# Patient Record
Sex: Female | Born: 1950 | Race: White | Hispanic: No | Marital: Married | Smoking: Never smoker
Health system: Southern US, Community
[De-identification: ages and names within clinical notes are randomized; demographics above are authoritative.]

## PROBLEM LIST (undated history)

## (undated) DIAGNOSIS — I10 Essential (primary) hypertension: Secondary | ICD-10-CM

## (undated) DIAGNOSIS — E785 Hyperlipidemia, unspecified: Secondary | ICD-10-CM

## (undated) HISTORY — PX: ABDOMINAL HYSTERECTOMY: SHX81

## (undated) HISTORY — DX: Essential (primary) hypertension: I10

## (undated) HISTORY — DX: Hyperlipidemia, unspecified: E78.5

---

## 1998-10-19 ENCOUNTER — Other Ambulatory Visit: Admission: RE | Admit: 1998-10-19 | Discharge: 1998-10-19 | Payer: Self-pay | Admitting: Obstetrics and Gynecology

## 1998-10-28 ENCOUNTER — Encounter: Payer: Self-pay | Admitting: Obstetrics and Gynecology

## 1998-10-28 ENCOUNTER — Ambulatory Visit (HOSPITAL_COMMUNITY): Admission: RE | Admit: 1998-10-28 | Discharge: 1998-10-28 | Payer: Self-pay | Admitting: Obstetrics and Gynecology

## 1999-11-01 ENCOUNTER — Ambulatory Visit (HOSPITAL_COMMUNITY): Admission: RE | Admit: 1999-11-01 | Discharge: 1999-11-01 | Payer: Self-pay | Admitting: Obstetrics and Gynecology

## 1999-11-01 ENCOUNTER — Encounter: Payer: Self-pay | Admitting: Obstetrics and Gynecology

## 2000-10-01 ENCOUNTER — Other Ambulatory Visit: Admission: RE | Admit: 2000-10-01 | Discharge: 2000-10-01 | Payer: Self-pay | Admitting: Obstetrics and Gynecology

## 2000-11-04 ENCOUNTER — Encounter: Payer: Self-pay | Admitting: Obstetrics and Gynecology

## 2000-11-04 ENCOUNTER — Ambulatory Visit (HOSPITAL_COMMUNITY): Admission: RE | Admit: 2000-11-04 | Discharge: 2000-11-04 | Payer: Self-pay | Admitting: Obstetrics and Gynecology

## 2001-10-01 ENCOUNTER — Other Ambulatory Visit: Admission: RE | Admit: 2001-10-01 | Discharge: 2001-10-01 | Payer: Self-pay | Admitting: Obstetrics and Gynecology

## 2001-11-19 ENCOUNTER — Encounter: Payer: Self-pay | Admitting: Obstetrics and Gynecology

## 2001-11-19 ENCOUNTER — Ambulatory Visit (HOSPITAL_COMMUNITY): Admission: RE | Admit: 2001-11-19 | Discharge: 2001-11-19 | Payer: Self-pay | Admitting: Obstetrics and Gynecology

## 2003-10-19 ENCOUNTER — Ambulatory Visit (HOSPITAL_COMMUNITY): Admission: RE | Admit: 2003-10-19 | Discharge: 2003-10-19 | Payer: Self-pay | Admitting: Obstetrics and Gynecology

## 2004-10-30 ENCOUNTER — Ambulatory Visit (HOSPITAL_COMMUNITY): Admission: RE | Admit: 2004-10-30 | Discharge: 2004-10-30 | Payer: Self-pay | Admitting: Obstetrics and Gynecology

## 2005-02-09 ENCOUNTER — Other Ambulatory Visit: Admission: RE | Admit: 2005-02-09 | Discharge: 2005-02-09 | Payer: Self-pay | Admitting: Obstetrics and Gynecology

## 2005-12-26 ENCOUNTER — Ambulatory Visit (HOSPITAL_COMMUNITY): Admission: RE | Admit: 2005-12-26 | Discharge: 2005-12-26 | Payer: Self-pay | Admitting: Obstetrics and Gynecology

## 2006-02-12 ENCOUNTER — Other Ambulatory Visit: Admission: RE | Admit: 2006-02-12 | Discharge: 2006-02-12 | Payer: Self-pay | Admitting: Obstetrics and Gynecology

## 2007-02-14 ENCOUNTER — Other Ambulatory Visit: Admission: RE | Admit: 2007-02-14 | Discharge: 2007-02-14 | Payer: Self-pay | Admitting: Obstetrics and Gynecology

## 2007-06-24 ENCOUNTER — Inpatient Hospital Stay (HOSPITAL_COMMUNITY): Admission: AD | Admit: 2007-06-24 | Discharge: 2007-06-26 | Payer: Self-pay | Admitting: Surgery

## 2008-03-03 ENCOUNTER — Other Ambulatory Visit: Admission: RE | Admit: 2008-03-03 | Discharge: 2008-03-03 | Payer: Self-pay | Admitting: Obstetrics and Gynecology

## 2009-03-29 ENCOUNTER — Other Ambulatory Visit: Admission: RE | Admit: 2009-03-29 | Discharge: 2009-03-29 | Payer: Self-pay | Admitting: Obstetrics and Gynecology

## 2010-03-30 ENCOUNTER — Other Ambulatory Visit: Admission: RE | Admit: 2010-03-30 | Discharge: 2010-03-30 | Payer: Self-pay | Admitting: Obstetrics and Gynecology

## 2011-04-17 NOTE — Discharge Summary (Signed)
[name], Tonya Palmer              ACCOUNT NO.:  [id number]   MEDICAL RECORD NO.:  [id number]          PATIENT TYPE:  INP   LOCATION:  1323                         FACILITY:  [facility]   PHYSICIAN:  Ardeth Sportsman, MD     DATE OF BIRTH:  1950/12/19   DATE OF ADMISSION:  06/24/2007  DATE OF DISCHARGE:  06/26/2007                               DISCHARGE SUMMARY   PRIMARY CARE PHYSICIAN:  Kirby Funk, M.D.   SURGEON:  Karie Soda, M.D.   DIAGNOSES:  1. Cellulitis of right posterior thigh, methicillin-resistant      Staphylococcus aureus.  2. Hypertension.  3. Hypercholesterolemia.  4. Status post hysterectomy.  5. Status post incision and drainage of right gluteal abscess on May 03, 2007, responsive to Keflex.   PROCEDURE PERFORMED:  Incision and drainage of right posterior thigh  abscess on June 19, 2007.   DATE OF ADMISSION:  June 24, 2007.   DATE OF DISCHARGE:  June 26, 2007.   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  [facility] and was started on Keflex.  She  did not improve and saw her primary care physician who switched her over  to doxycycline and sent her over to our office.  We saw her later that  day and Dr. Violeta Gelinas performed incision and drainage on June 19, 2007.  On followup the next day seemed to show improvement.  She went to  work and unfortunately after 3 days of work her pain worsened and she  came in on June 24, 2007.  Her redness had markedly worsened and she was  much more painful.  Based on concerns, I recommended admission and she  agreed.  She was placed on IV vancomycin and after 48 hours of dosing  her redness and erythema markedly improved.  She remained afebrile.  She  did not have any significant leukocytosis.  The induration markedly  softened up.  Wound cleaned up rather well also with a 25 x 35 mm ulcer  that remained rather superficial with no more deep areas.  Based on  these improvements I agree she will be discharged home with the  following instructions:  1. She should take Bactrim DS two p.o. b.i.d. for the next 2 weeks      with 1 refill if needed.  2. She should follow up and see me in about a week for wound check to      make sure this continues to improve.  3. She should not return to work until the June 30, 2007, to avoid re-      exacerbating any further tissues.  4. She should do warm packs and compresses as needed for pain and      ibuprofen 600 mg p.o. q.a.c. and q.h.s. to help decrease pain and      induration.  5. She already had a prescription for some Darvocet q.4-6h. p.r.n.  pain.  I think it is reasonable to continue that.  6. She can continue her home medication which includes      lisinopril/hydrochlorothiazide 10/12.5 p.o. daily,      aspirin 81 mg daily, multivitamin daily, Lipitor 10 mg daily, and      calcium daily.  7. She should call if she has worsening pain, swelling or discomfort      or any other concerns.   Note, I did send a work note for the patient as well.      Ardeth Sportsman, MD  Electronically Signed     SCG/MEDQ  D:  06/26/2007  T:  06/26/2007  Job:  [id number]   cc:   Thora Lance, M.D.  Fax: [phone]

## 2011-04-17 NOTE — H&P (Signed)
Tonya Palmer, Tonya Palmer              ACCOUNT NO.:  [id number]   MEDICAL RECORD NO.:  [id number]          PATIENT TYPE:  INP   LOCATION:  1323                         FACILITY:  [facility]   PHYSICIAN:  Ardeth Sportsman, MD     DATE OF BIRTH:  July 01, 1951   DATE OF ADMISSION:  06/24/2007  DATE OF DISCHARGE:                              HISTORY & PHYSICAL   PRIMARY CARE PHYSICIAN:  Dr. Kirby Funk   REASON FOR ADMISSION:  MRSA cellulitis right posterior thigh, worse  despite outpatient therapy.   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  She had a buttock abscess that  resolved after incision and drainage and Keflex earlier in the year.  However, last week she started noting some pain, swelling and discomfort  of the right posterior thigh.  She went to an urgent care center and was  started on Keflex.  She went to see her primary care physician a few  days later on June 17 and was started on doxycycline.  She was sent to  our clinic and underwent incision and drainage 3 days ago.  She was  improved on a visit the following day.  She follows up today because she  is having worsening pain and discomfort.   She denies any history of MRSA exposure but she does work with a Museum/gallery exhibitions officer.  She denies any fevers, chills, sweats.  She was up and working  all through the weekend, 12 hours at a time.  She denies any new other  sores or lesions.   PAST MEDICAL HISTORY:  1. Hypertension.  2. Hypercholesterolemia.   PAST SURGICAL HISTORY:  1. She had a hysterectomy.  2. Incision and drainage of right gluteal abscess on May 31.  3. Incision and drainage right posterior thigh abscess on May 20, 2007.   MEDICATIONS:  1. Doxycycline 100 mg p.o. b.i.d.  2. Lisinopril daily.  3. Lipitor 10 mg daily.  4. Aspirin 81 mg daily.  5. Calcium daily.  6. Multivitamin daily.   ALLERGIES:  None.   SOCIAL HISTORY:  No tobacco  use.  Drinks one glass of wine.  Works as a  Engineer, civil (consulting) over at [employer].   FAMILY HISTORY:  Her father died of heart disease.  Her mother and  brother are alive and well.  She has two sisters with diabetes and one  with breast cancer.  Note, 15-point comprehensive review of systems is  reviewed with the patient in our chart and is all essentially normal  except for the skin.  Otherwise, constitutional, eyes, ENT,  cardiovascular, pulmonary, GI, GU, breast, skin, musculoskeletal,  neurological, psychiatric, endocrine, heme, lymph and immunologic are  otherwise negative except for her skin as noted per HPI.   VITAL SIGNS:  Temperature 98.3, pulse is 70, respirations 18, blood  pressure 130/80.  Height 5 feet 6 inches, weight 194.  GENERAL:  She is a well-developed, well-nourished female in no acute  distress, slightly overweight.  PSYCHIATRIC:  She is pleasant, interactive, with at least average  intelligence.  No evidence of dementia, delirium, psychosis, paranoia.  EYES:  Pupils equal, round, and reactive to light.  Extraocular  movements are intact.  Sclerae are not icteric or injected.  ENT:  Normocephalic, no facial asymmetry.  Mucous membranes moist.  Nasopharynx and oropharynx clear.  NECK:  Supple without any masses.  Trachea is midline.  HEART:  Regular rate and rhythm.  No murmurs, gallops or rubs.  CHEST:  Clear to auscultation bilaterally.  No wheezes, rales, or  rhonchi.  ABDOMEN:  Soft, nontender, nondistended.  SKIN:  On her right posterior thigh she has a 3 x 4-cm ulcer with some  granulation base.  I went ahead and debrided that and encountered some  pretty good granulation tissue that is exquisitely tender.  She does  have some small pits but I cannot express any more purulence.  She has  about a 15 x 25-cm patch of erythema with warmth and some tenderness.  No evidence of any other dermatologic abnormalities on her skin.  LYMPH:  No head, neck,  axillary or groin lymphadenopathy except for some  shotty lymphadenopathy in the right groin but subtle.  BREAST:  Deferred.  RECTAL:  Deferred.  EXTREMITIES:  Otherwise, no clubbing or cyanosis.  MUSCULOSKELETAL:  Full range of motion of the shoulders, elbows, wrists,  hips, knees, ankles.   STUDIES:  She has a culture that is consistent with methicillin-  resistant Staphylococcus aureus.  It is sensitive to doxycycline.   ASSESSMENT AND PLAN:  Right posterior thigh abscess that has failed  outpatient management with appropriate oral antibiotic, incision and  drainage, with worsening erythema.   I had a long conversation with the patient and also had one with my  senior partners, Dr. Derrell Lolling, discuss with the patient as well.  Because  she has failed oral antibiotic therapy, incision and drainage, with  worsening erythema I strongly recommend she have admission and placed on  IV antibiotics.  If she is not improved she may require incision and  debridement to further evaluate this and that would require surgery as  it is exquisitely tender and she cannot tolerate any further dissection.  She was initially hesitant on this but understands that if we are not  more aggressive it will worsen, and certainly she has tried all  outpatient options.  If she improves, maybe we can switch her over to  oral antibiotics in a couple of days if the erythema goes down versus  sending her on outpatient vancomycin therapy.  She agrees to proceed.      Ardeth Sportsman, MD  Electronically Signed    SCG/MEDQ  D:  06/24/2007  T:  06/24/2007  Job:  [id number]

## 2011-09-17 LAB — CBC
HCT: 40.6
MCV: 88.1
Platelets: 279
Platelets: 309
RBC: 4.39
RDW: 13.5
WBC: 8.1
WBC: 8.4

## 2011-09-17 LAB — BASIC METABOLIC PANEL
BUN: 17
Chloride: 105
Creatinine, Ser: 0.84
GFR calc Af Amer: >60
GFR calc non Af Amer: >60
Potassium: 3.9

## 2014-12-06 ENCOUNTER — Ambulatory Visit
Admission: RE | Admit: 2014-12-06 | Discharge: 2014-12-06 | Disposition: A | Discharge status: Home / Self Care | Payer: Commercial Managed Care - HMO | Source: Ambulatory Visit | Attending: Internal Medicine | Admitting: Internal Medicine

## 2014-12-06 ENCOUNTER — Other Ambulatory Visit: Payer: Self-pay | Admitting: Internal Medicine

## 2014-12-06 DIAGNOSIS — M25562 Pain in left knee: Secondary | ICD-10-CM

## 2014-12-06 DIAGNOSIS — M25462 Effusion, left knee: Secondary | ICD-10-CM | POA: Diagnosis not present

## 2014-12-06 DIAGNOSIS — M1712 Unilateral primary osteoarthritis, left knee: Secondary | ICD-10-CM | POA: Diagnosis not present

## 2014-12-22 DIAGNOSIS — M25562 Pain in left knee: Secondary | ICD-10-CM | POA: Diagnosis not present

## 2014-12-22 DIAGNOSIS — M1712 Unilateral primary osteoarthritis, left knee: Secondary | ICD-10-CM | POA: Diagnosis not present

## 2014-12-22 DIAGNOSIS — M2242 Chondromalacia patellae, left knee: Secondary | ICD-10-CM | POA: Diagnosis not present

## 2014-12-22 DIAGNOSIS — M25462 Effusion, left knee: Secondary | ICD-10-CM | POA: Diagnosis not present

## 2015-03-01 DIAGNOSIS — I10 Essential (primary) hypertension: Secondary | ICD-10-CM | POA: Diagnosis not present

## 2015-03-08 DIAGNOSIS — M1712 Unilateral primary osteoarthritis, left knee: Secondary | ICD-10-CM | POA: Diagnosis not present

## 2015-03-08 DIAGNOSIS — M25562 Pain in left knee: Secondary | ICD-10-CM | POA: Diagnosis not present

## 2015-03-08 DIAGNOSIS — M2242 Chondromalacia patellae, left knee: Secondary | ICD-10-CM | POA: Diagnosis not present

## 2015-04-26 DIAGNOSIS — M1712 Unilateral primary osteoarthritis, left knee: Secondary | ICD-10-CM | POA: Diagnosis not present

## 2015-05-04 DIAGNOSIS — M1712 Unilateral primary osteoarthritis, left knee: Secondary | ICD-10-CM | POA: Diagnosis not present

## 2015-05-11 DIAGNOSIS — M1712 Unilateral primary osteoarthritis, left knee: Secondary | ICD-10-CM | POA: Diagnosis not present

## 2015-09-02 DIAGNOSIS — I10 Essential (primary) hypertension: Secondary | ICD-10-CM | POA: Diagnosis not present

## 2015-09-02 DIAGNOSIS — Z23 Encounter for immunization: Secondary | ICD-10-CM | POA: Diagnosis not present

## 2015-09-02 DIAGNOSIS — Z1389 Encounter for screening for other disorder: Secondary | ICD-10-CM | POA: Diagnosis not present

## 2015-09-02 DIAGNOSIS — Z6832 Body mass index (BMI) 32.0-32.9, adult: Secondary | ICD-10-CM | POA: Diagnosis not present

## 2015-09-02 DIAGNOSIS — E669 Obesity, unspecified: Secondary | ICD-10-CM | POA: Diagnosis not present

## 2015-09-02 DIAGNOSIS — K635 Polyp of colon: Secondary | ICD-10-CM | POA: Diagnosis not present

## 2015-09-02 DIAGNOSIS — E780 Pure hypercholesterolemia: Secondary | ICD-10-CM | POA: Diagnosis not present

## 2015-11-04 DIAGNOSIS — Z800 Family history of malignant neoplasm of digestive organs: Secondary | ICD-10-CM | POA: Diagnosis not present

## 2015-11-16 ENCOUNTER — Other Ambulatory Visit: Payer: Self-pay

## 2015-11-16 DIAGNOSIS — Z1231 Encounter for screening mammogram for malignant neoplasm of breast: Secondary | ICD-10-CM

## 2015-12-14 DIAGNOSIS — K621 Rectal polyp: Secondary | ICD-10-CM | POA: Diagnosis not present

## 2015-12-14 DIAGNOSIS — Z800 Family history of malignant neoplasm of digestive organs: Secondary | ICD-10-CM | POA: Diagnosis not present

## 2015-12-14 DIAGNOSIS — K5730 Diverticulosis of large intestine without perforation or abscess without bleeding: Secondary | ICD-10-CM | POA: Diagnosis not present

## 2015-12-15 ENCOUNTER — Ambulatory Visit
Admission: RE | Admit: 2015-12-15 | Discharge: 2015-12-15 | Disposition: A | Discharge status: Home / Self Care | Payer: Commercial Managed Care - HMO | Source: Ambulatory Visit

## 2015-12-15 DIAGNOSIS — Z1231 Encounter for screening mammogram for malignant neoplasm of breast: Secondary | ICD-10-CM | POA: Diagnosis not present

## 2015-12-22 ENCOUNTER — Other Ambulatory Visit: Payer: Self-pay | Admitting: Internal Medicine

## 2015-12-22 DIAGNOSIS — R928 Other abnormal and inconclusive findings on diagnostic imaging of breast: Secondary | ICD-10-CM

## 2015-12-23 ENCOUNTER — Ambulatory Visit
Admission: RE | Admit: 2015-12-23 | Discharge: 2015-12-23 | Disposition: A | Discharge status: Home / Self Care | Payer: Commercial Managed Care - HMO | Source: Ambulatory Visit | Attending: Internal Medicine | Admitting: Internal Medicine

## 2015-12-23 DIAGNOSIS — N63 Unspecified lump in breast: Secondary | ICD-10-CM | POA: Diagnosis not present

## 2015-12-23 DIAGNOSIS — N6001 Solitary cyst of right breast: Secondary | ICD-10-CM | POA: Diagnosis not present

## 2015-12-23 DIAGNOSIS — R928 Other abnormal and inconclusive findings on diagnostic imaging of breast: Secondary | ICD-10-CM

## 2016-01-21 IMAGING — CR DG KNEE 1-2V*L*
2 series · 2 of 2 positions shown · non-contrast
Comparison: None.

CLINICAL DATA: 63-year-old female with chronic left knee pain. No
injury. Initial encounter.

EXAM:
LEFT KNEE - 1-2 VIEW

[view not recorded (1 of 2)]
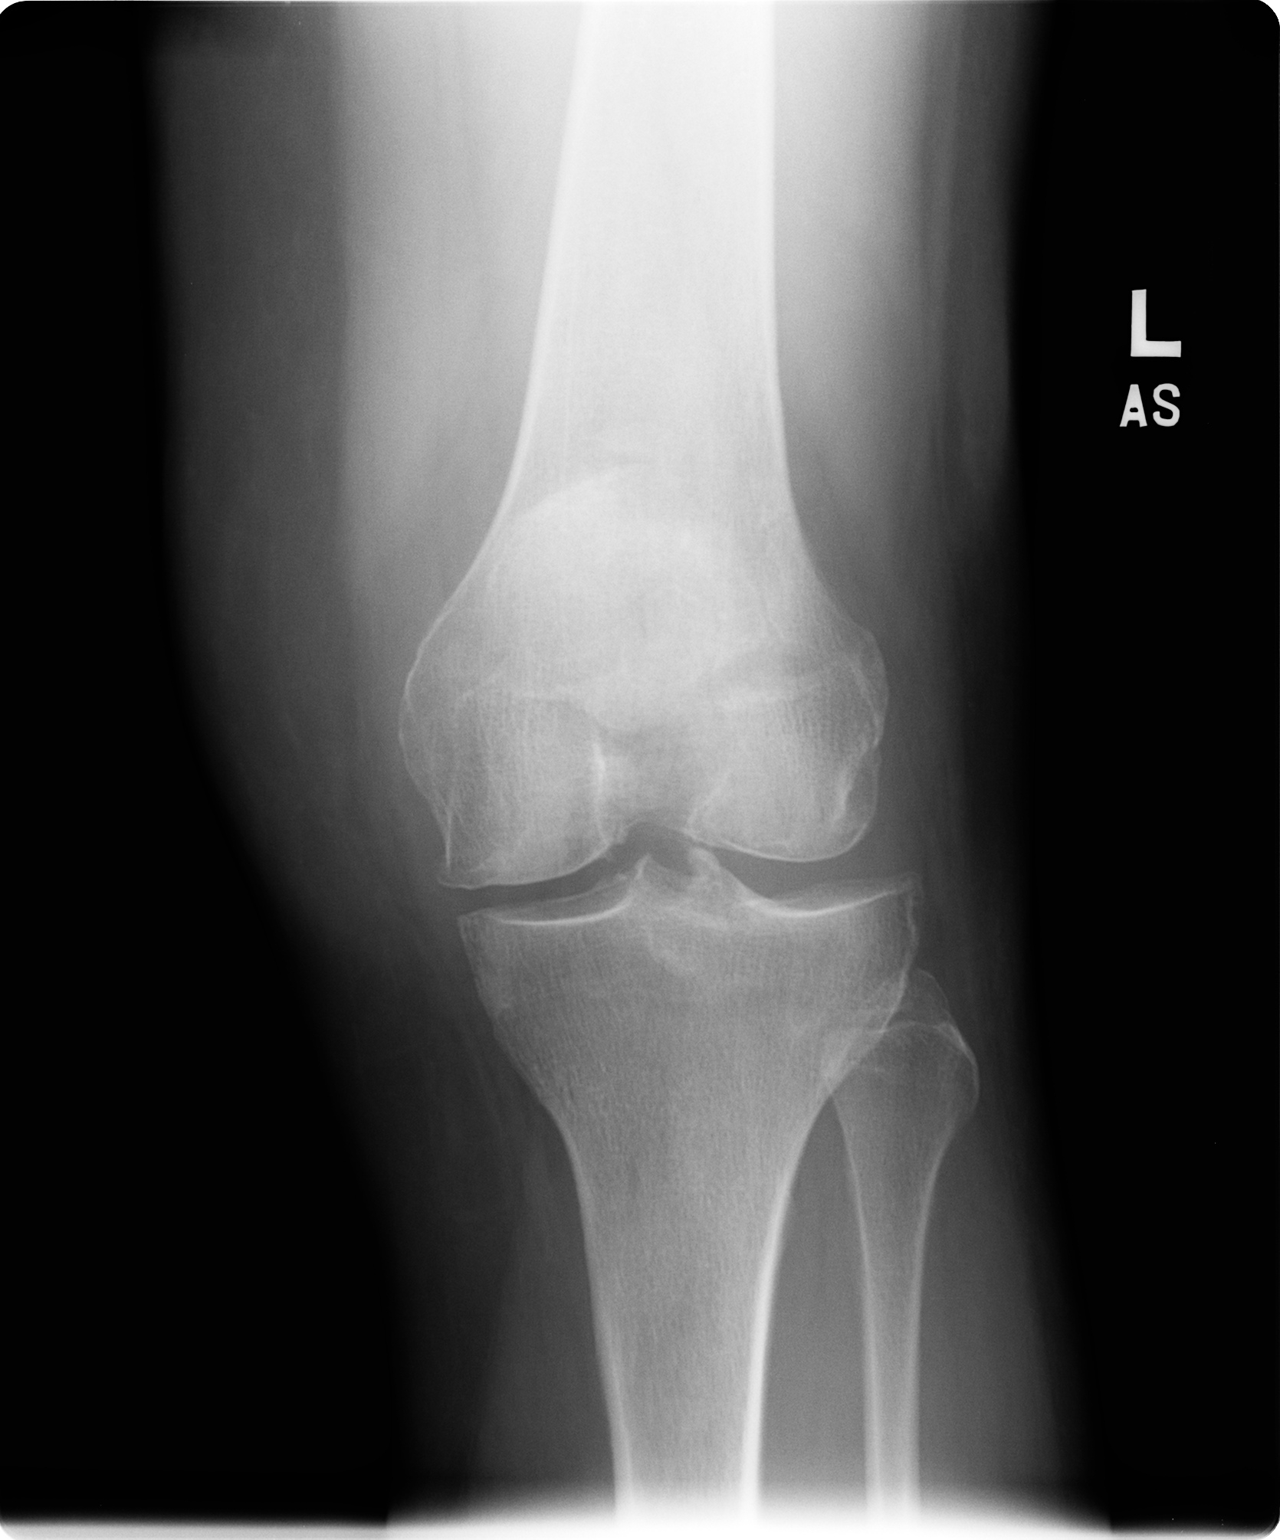

[view not recorded (2 of 2)]
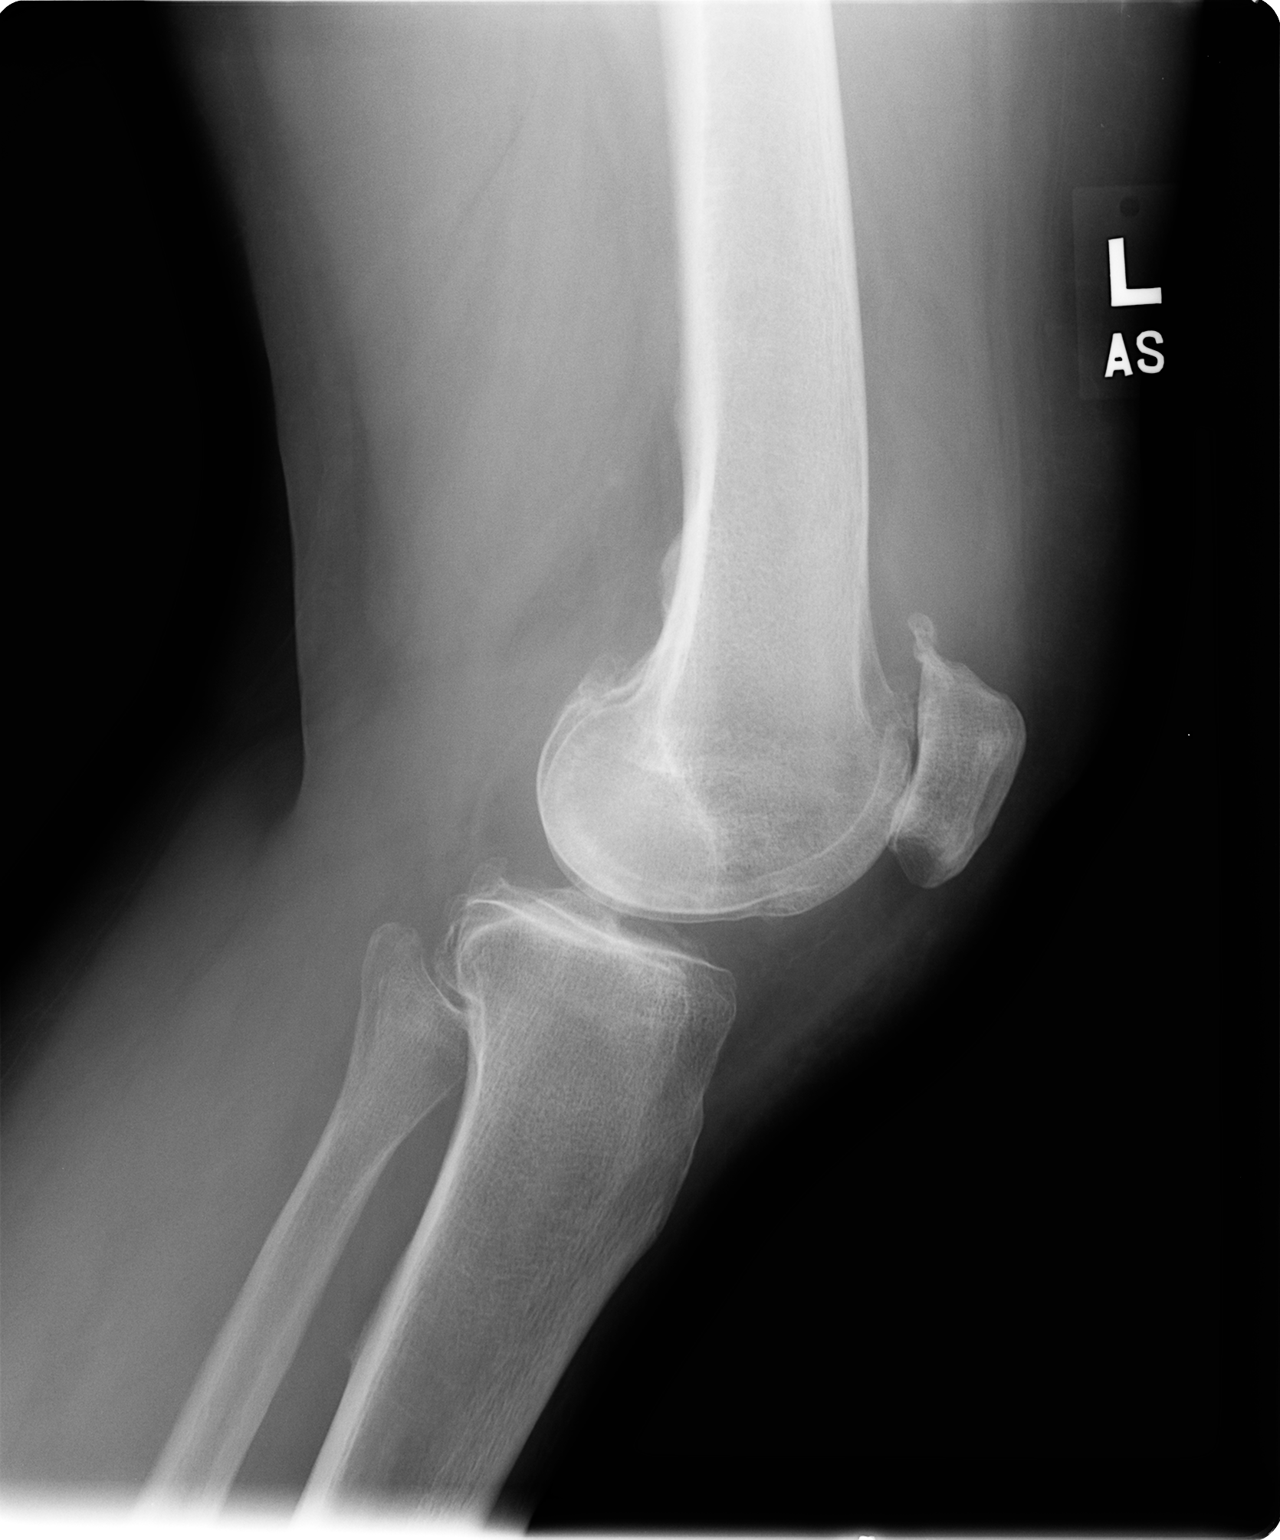

[2 of 2 positions shown; findings below may reference images not displayed]

FINDINGS: Moderate to marked patellofemoral joint degenerative changes with
slightly high riding patella.

Mild to slightly moderate medial tibiofemoral joint space narrowing
with minimal shift of the femur medially compared to the distal
tibia.

Joint effusion.

No fracture or dislocation is noted on this two view exam.
IMPRESSION: Degenerative changes most prominent involving the patellofemoral
joint space. Less notable degenerative changes medial tibiofemoral
joint space.

Joint effusion.

## 2016-05-03 DIAGNOSIS — Z01419 Encounter for gynecological examination (general) (routine) without abnormal findings: Secondary | ICD-10-CM | POA: Diagnosis not present

## 2016-05-03 DIAGNOSIS — Z7189 Other specified counseling: Secondary | ICD-10-CM | POA: Diagnosis not present

## 2016-07-03 DIAGNOSIS — L237 Allergic contact dermatitis due to plants, except food: Secondary | ICD-10-CM | POA: Diagnosis not present

## 2016-07-03 DIAGNOSIS — R600 Localized edema: Secondary | ICD-10-CM | POA: Diagnosis not present

## 2016-07-05 DIAGNOSIS — R600 Localized edema: Secondary | ICD-10-CM | POA: Diagnosis not present

## 2016-07-05 DIAGNOSIS — L237 Allergic contact dermatitis due to plants, except food: Secondary | ICD-10-CM | POA: Diagnosis not present

## 2016-09-04 DIAGNOSIS — Z6830 Body mass index (BMI) 30.0-30.9, adult: Secondary | ICD-10-CM | POA: Diagnosis not present

## 2016-09-04 DIAGNOSIS — Z1389 Encounter for screening for other disorder: Secondary | ICD-10-CM | POA: Diagnosis not present

## 2016-09-04 DIAGNOSIS — E7800 Pure hypercholesterolemia, unspecified: Secondary | ICD-10-CM | POA: Diagnosis not present

## 2016-09-04 DIAGNOSIS — Z23 Encounter for immunization: Secondary | ICD-10-CM | POA: Diagnosis not present

## 2016-09-04 DIAGNOSIS — E669 Obesity, unspecified: Secondary | ICD-10-CM | POA: Diagnosis not present

## 2016-09-04 DIAGNOSIS — Z0000 Encounter for general adult medical examination without abnormal findings: Secondary | ICD-10-CM | POA: Diagnosis not present

## 2016-09-04 DIAGNOSIS — I10 Essential (primary) hypertension: Secondary | ICD-10-CM | POA: Diagnosis not present

## 2016-11-12 ENCOUNTER — Other Ambulatory Visit: Payer: Self-pay | Admitting: Internal Medicine

## 2016-11-12 DIAGNOSIS — Z1231 Encounter for screening mammogram for malignant neoplasm of breast: Secondary | ICD-10-CM

## 2016-12-20 ENCOUNTER — Ambulatory Visit: Payer: Commercial Managed Care - HMO

## 2016-12-21 ENCOUNTER — Ambulatory Visit
Admission: RE | Admit: 2016-12-21 | Discharge: 2016-12-21 | Disposition: A | Discharge status: Home / Self Care | Payer: Medicare HMO | Source: Ambulatory Visit | Attending: Internal Medicine | Admitting: Internal Medicine

## 2016-12-21 ENCOUNTER — Encounter: Payer: Self-pay | Admitting: Radiology

## 2016-12-21 DIAGNOSIS — Z1231 Encounter for screening mammogram for malignant neoplasm of breast: Secondary | ICD-10-CM

## 2017-02-26 DIAGNOSIS — M1711 Unilateral primary osteoarthritis, right knee: Secondary | ICD-10-CM | POA: Diagnosis not present

## 2017-02-26 DIAGNOSIS — G8929 Other chronic pain: Secondary | ICD-10-CM | POA: Diagnosis not present

## 2017-02-26 DIAGNOSIS — M2242 Chondromalacia patellae, left knee: Secondary | ICD-10-CM | POA: Diagnosis not present

## 2017-02-26 DIAGNOSIS — M25561 Pain in right knee: Secondary | ICD-10-CM | POA: Diagnosis not present

## 2017-02-26 DIAGNOSIS — M1712 Unilateral primary osteoarthritis, left knee: Secondary | ICD-10-CM | POA: Diagnosis not present

## 2017-05-07 DIAGNOSIS — Z01419 Encounter for gynecological examination (general) (routine) without abnormal findings: Secondary | ICD-10-CM | POA: Diagnosis not present

## 2017-05-07 DIAGNOSIS — M1712 Unilateral primary osteoarthritis, left knee: Secondary | ICD-10-CM | POA: Diagnosis not present

## 2017-05-14 DIAGNOSIS — M1712 Unilateral primary osteoarthritis, left knee: Secondary | ICD-10-CM | POA: Diagnosis not present

## 2017-05-21 DIAGNOSIS — M1712 Unilateral primary osteoarthritis, left knee: Secondary | ICD-10-CM | POA: Diagnosis not present

## 2017-09-06 DIAGNOSIS — I10 Essential (primary) hypertension: Secondary | ICD-10-CM | POA: Diagnosis not present

## 2017-09-06 DIAGNOSIS — Z1159 Encounter for screening for other viral diseases: Secondary | ICD-10-CM | POA: Diagnosis not present

## 2017-09-06 DIAGNOSIS — E7800 Pure hypercholesterolemia, unspecified: Secondary | ICD-10-CM | POA: Diagnosis not present

## 2017-09-06 DIAGNOSIS — Z23 Encounter for immunization: Secondary | ICD-10-CM | POA: Diagnosis not present

## 2017-09-06 DIAGNOSIS — Z1389 Encounter for screening for other disorder: Secondary | ICD-10-CM | POA: Diagnosis not present

## 2017-09-06 DIAGNOSIS — E669 Obesity, unspecified: Secondary | ICD-10-CM | POA: Diagnosis not present

## 2017-09-06 DIAGNOSIS — Z6831 Body mass index (BMI) 31.0-31.9, adult: Secondary | ICD-10-CM | POA: Diagnosis not present

## 2017-09-06 DIAGNOSIS — Z0000 Encounter for general adult medical examination without abnormal findings: Secondary | ICD-10-CM | POA: Diagnosis not present

## 2017-10-29 DIAGNOSIS — R6884 Jaw pain: Secondary | ICD-10-CM | POA: Diagnosis not present

## 2017-11-13 ENCOUNTER — Other Ambulatory Visit: Payer: Self-pay | Admitting: Internal Medicine

## 2017-11-13 DIAGNOSIS — Z1231 Encounter for screening mammogram for malignant neoplasm of breast: Secondary | ICD-10-CM

## 2017-12-23 ENCOUNTER — Ambulatory Visit
Admission: RE | Admit: 2017-12-23 | Discharge: 2017-12-23 | Disposition: A | Discharge status: Home / Self Care | Payer: Medicare HMO | Source: Ambulatory Visit | Attending: Internal Medicine | Admitting: Internal Medicine

## 2017-12-23 DIAGNOSIS — Z1231 Encounter for screening mammogram for malignant neoplasm of breast: Secondary | ICD-10-CM | POA: Diagnosis not present

## 2018-09-19 DIAGNOSIS — I10 Essential (primary) hypertension: Secondary | ICD-10-CM | POA: Diagnosis not present

## 2018-09-19 DIAGNOSIS — Z1389 Encounter for screening for other disorder: Secondary | ICD-10-CM | POA: Diagnosis not present

## 2018-09-19 DIAGNOSIS — Z0000 Encounter for general adult medical examination without abnormal findings: Secondary | ICD-10-CM | POA: Diagnosis not present

## 2018-09-19 DIAGNOSIS — Z23 Encounter for immunization: Secondary | ICD-10-CM | POA: Diagnosis not present

## 2018-09-19 DIAGNOSIS — E669 Obesity, unspecified: Secondary | ICD-10-CM | POA: Diagnosis not present

## 2018-09-19 DIAGNOSIS — E7800 Pure hypercholesterolemia, unspecified: Secondary | ICD-10-CM | POA: Diagnosis not present

## 2018-11-20 ENCOUNTER — Other Ambulatory Visit: Payer: Self-pay | Admitting: Internal Medicine

## 2018-11-20 DIAGNOSIS — Z1231 Encounter for screening mammogram for malignant neoplasm of breast: Secondary | ICD-10-CM

## 2018-12-25 ENCOUNTER — Ambulatory Visit
Admission: RE | Admit: 2018-12-25 | Discharge: 2018-12-25 | Disposition: A | Discharge status: Home / Self Care | Payer: Medicare HMO | Source: Ambulatory Visit | Attending: Internal Medicine | Admitting: Internal Medicine

## 2018-12-25 DIAGNOSIS — Z1231 Encounter for screening mammogram for malignant neoplasm of breast: Secondary | ICD-10-CM

## 2019-03-20 DIAGNOSIS — S90561A Insect bite (nonvenomous), right ankle, initial encounter: Secondary | ICD-10-CM | POA: Diagnosis not present

## 2019-03-20 DIAGNOSIS — W57XXXA Bitten or stung by nonvenomous insect and other nonvenomous arthropods, initial encounter: Secondary | ICD-10-CM | POA: Diagnosis not present

## 2019-06-18 DIAGNOSIS — Z01419 Encounter for gynecological examination (general) (routine) without abnormal findings: Secondary | ICD-10-CM | POA: Diagnosis not present

## 2019-06-18 DIAGNOSIS — N898 Other specified noninflammatory disorders of vagina: Secondary | ICD-10-CM | POA: Diagnosis not present

## 2019-10-02 DIAGNOSIS — I10 Essential (primary) hypertension: Secondary | ICD-10-CM | POA: Diagnosis not present

## 2019-10-02 DIAGNOSIS — E7800 Pure hypercholesterolemia, unspecified: Secondary | ICD-10-CM | POA: Diagnosis not present

## 2019-10-02 DIAGNOSIS — Z1389 Encounter for screening for other disorder: Secondary | ICD-10-CM | POA: Diagnosis not present

## 2019-10-02 DIAGNOSIS — Z0000 Encounter for general adult medical examination without abnormal findings: Secondary | ICD-10-CM | POA: Diagnosis not present

## 2019-10-02 DIAGNOSIS — Z23 Encounter for immunization: Secondary | ICD-10-CM | POA: Diagnosis not present

## 2019-10-31 DIAGNOSIS — Z20828 Contact with and (suspected) exposure to other viral communicable diseases: Secondary | ICD-10-CM | POA: Diagnosis not present

## 2019-11-16 ENCOUNTER — Other Ambulatory Visit: Payer: Self-pay | Admitting: Internal Medicine

## 2019-11-16 DIAGNOSIS — Z1231 Encounter for screening mammogram for malignant neoplasm of breast: Secondary | ICD-10-CM

## 2020-01-01 ENCOUNTER — Other Ambulatory Visit: Payer: Self-pay

## 2020-01-01 ENCOUNTER — Ambulatory Visit
Admission: RE | Admit: 2020-01-01 | Discharge: 2020-01-01 | Disposition: A | Discharge status: Home / Self Care | Payer: Medicare HMO | Source: Ambulatory Visit | Attending: Internal Medicine | Admitting: Internal Medicine

## 2020-01-01 DIAGNOSIS — Z1231 Encounter for screening mammogram for malignant neoplasm of breast: Secondary | ICD-10-CM

## 2020-04-29 DIAGNOSIS — R310 Gross hematuria: Secondary | ICD-10-CM | POA: Diagnosis not present

## 2020-04-29 DIAGNOSIS — N3001 Acute cystitis with hematuria: Secondary | ICD-10-CM | POA: Diagnosis not present

## 2020-04-29 DIAGNOSIS — R3915 Urgency of urination: Secondary | ICD-10-CM | POA: Diagnosis not present

## 2020-04-29 DIAGNOSIS — R300 Dysuria: Secondary | ICD-10-CM | POA: Diagnosis not present

## 2020-10-04 DIAGNOSIS — Z7184 Encounter for health counseling related to travel: Secondary | ICD-10-CM | POA: Diagnosis not present

## 2020-10-04 DIAGNOSIS — I10 Essential (primary) hypertension: Secondary | ICD-10-CM | POA: Diagnosis not present

## 2020-10-04 DIAGNOSIS — R059 Cough, unspecified: Secondary | ICD-10-CM | POA: Diagnosis not present

## 2020-10-04 DIAGNOSIS — Z23 Encounter for immunization: Secondary | ICD-10-CM | POA: Diagnosis not present

## 2020-10-04 DIAGNOSIS — Z0000 Encounter for general adult medical examination without abnormal findings: Secondary | ICD-10-CM | POA: Diagnosis not present

## 2020-10-04 DIAGNOSIS — E7800 Pure hypercholesterolemia, unspecified: Secondary | ICD-10-CM | POA: Diagnosis not present

## 2020-12-22 ENCOUNTER — Other Ambulatory Visit: Payer: Self-pay | Admitting: Internal Medicine

## 2020-12-22 DIAGNOSIS — Z1231 Encounter for screening mammogram for malignant neoplasm of breast: Secondary | ICD-10-CM

## 2020-12-22 DIAGNOSIS — R350 Frequency of micturition: Secondary | ICD-10-CM | POA: Diagnosis not present

## 2021-02-03 ENCOUNTER — Ambulatory Visit
Admission: RE | Admit: 2021-02-03 | Discharge: 2021-02-03 | Disposition: A | Discharge status: Home / Self Care | Payer: Medicare HMO | Source: Ambulatory Visit | Attending: Internal Medicine | Admitting: Internal Medicine

## 2021-02-03 ENCOUNTER — Other Ambulatory Visit: Payer: Self-pay

## 2021-02-03 DIAGNOSIS — Z1231 Encounter for screening mammogram for malignant neoplasm of breast: Secondary | ICD-10-CM

## 2021-06-19 DIAGNOSIS — Z01419 Encounter for gynecological examination (general) (routine) without abnormal findings: Secondary | ICD-10-CM | POA: Diagnosis not present

## 2021-10-06 DIAGNOSIS — I10 Essential (primary) hypertension: Secondary | ICD-10-CM | POA: Diagnosis not present

## 2021-10-06 DIAGNOSIS — Z5181 Encounter for therapeutic drug level monitoring: Secondary | ICD-10-CM | POA: Diagnosis not present

## 2021-10-06 DIAGNOSIS — Z1389 Encounter for screening for other disorder: Secondary | ICD-10-CM | POA: Diagnosis not present

## 2021-10-06 DIAGNOSIS — Z0000 Encounter for general adult medical examination without abnormal findings: Secondary | ICD-10-CM | POA: Diagnosis not present

## 2021-10-06 DIAGNOSIS — E7800 Pure hypercholesterolemia, unspecified: Secondary | ICD-10-CM | POA: Diagnosis not present

## 2021-10-06 DIAGNOSIS — Z23 Encounter for immunization: Secondary | ICD-10-CM | POA: Diagnosis not present

## 2021-12-26 ENCOUNTER — Other Ambulatory Visit: Payer: Self-pay | Admitting: Internal Medicine

## 2021-12-26 DIAGNOSIS — Z1231 Encounter for screening mammogram for malignant neoplasm of breast: Secondary | ICD-10-CM

## 2022-02-15 ENCOUNTER — Ambulatory Visit
Admission: RE | Admit: 2022-02-15 | Discharge: 2022-02-15 | Disposition: A | Discharge status: Home / Self Care | Payer: Medicare HMO | Source: Ambulatory Visit | Attending: Internal Medicine | Admitting: Internal Medicine

## 2022-02-15 DIAGNOSIS — Z1231 Encounter for screening mammogram for malignant neoplasm of breast: Secondary | ICD-10-CM | POA: Diagnosis not present

## 2022-03-21 DIAGNOSIS — Z800 Family history of malignant neoplasm of digestive organs: Secondary | ICD-10-CM | POA: Diagnosis not present

## 2022-03-21 DIAGNOSIS — K649 Unspecified hemorrhoids: Secondary | ICD-10-CM | POA: Diagnosis not present

## 2022-03-21 DIAGNOSIS — D122 Benign neoplasm of ascending colon: Secondary | ICD-10-CM | POA: Diagnosis not present

## 2022-03-21 DIAGNOSIS — Z1211 Encounter for screening for malignant neoplasm of colon: Secondary | ICD-10-CM | POA: Diagnosis not present

## 2022-03-21 DIAGNOSIS — K5730 Diverticulosis of large intestine without perforation or abscess without bleeding: Secondary | ICD-10-CM | POA: Diagnosis not present

## 2022-03-23 DIAGNOSIS — D122 Benign neoplasm of ascending colon: Secondary | ICD-10-CM | POA: Diagnosis not present

## 2022-04-18 DIAGNOSIS — H52223 Regular astigmatism, bilateral: Secondary | ICD-10-CM | POA: Diagnosis not present

## 2022-04-18 DIAGNOSIS — H524 Presbyopia: Secondary | ICD-10-CM | POA: Diagnosis not present

## 2022-10-10 DIAGNOSIS — Z780 Asymptomatic menopausal state: Secondary | ICD-10-CM | POA: Diagnosis not present

## 2022-10-10 DIAGNOSIS — Z23 Encounter for immunization: Secondary | ICD-10-CM | POA: Diagnosis not present

## 2022-10-10 DIAGNOSIS — R1901 Right upper quadrant abdominal swelling, mass and lump: Secondary | ICD-10-CM | POA: Diagnosis not present

## 2022-10-10 DIAGNOSIS — I10 Essential (primary) hypertension: Secondary | ICD-10-CM | POA: Diagnosis not present

## 2022-10-10 DIAGNOSIS — M1710 Unilateral primary osteoarthritis, unspecified knee: Secondary | ICD-10-CM | POA: Diagnosis not present

## 2022-10-10 DIAGNOSIS — Z1331 Encounter for screening for depression: Secondary | ICD-10-CM | POA: Diagnosis not present

## 2022-10-10 DIAGNOSIS — E7800 Pure hypercholesterolemia, unspecified: Secondary | ICD-10-CM | POA: Diagnosis not present

## 2022-10-10 DIAGNOSIS — Z0000 Encounter for general adult medical examination without abnormal findings: Secondary | ICD-10-CM | POA: Diagnosis not present

## 2022-10-10 DIAGNOSIS — R10811 Right upper quadrant abdominal tenderness: Secondary | ICD-10-CM | POA: Diagnosis not present

## 2022-10-10 DIAGNOSIS — K5730 Diverticulosis of large intestine without perforation or abscess without bleeding: Secondary | ICD-10-CM | POA: Diagnosis not present

## 2022-10-17 DIAGNOSIS — R10811 Right upper quadrant abdominal tenderness: Secondary | ICD-10-CM | POA: Diagnosis not present

## 2022-10-17 DIAGNOSIS — N2889 Other specified disorders of kidney and ureter: Secondary | ICD-10-CM | POA: Diagnosis not present

## 2022-10-18 ENCOUNTER — Other Ambulatory Visit: Payer: Self-pay | Admitting: Internal Medicine

## 2022-10-18 ENCOUNTER — Ambulatory Visit
Admission: RE | Admit: 2022-10-18 | Discharge: 2022-10-18 | Disposition: A | Discharge status: Home / Self Care | Payer: Medicare HMO | Source: Ambulatory Visit | Attending: Internal Medicine | Admitting: Internal Medicine

## 2022-10-18 DIAGNOSIS — N2889 Other specified disorders of kidney and ureter: Secondary | ICD-10-CM | POA: Diagnosis not present

## 2022-10-18 DIAGNOSIS — K5730 Diverticulosis of large intestine without perforation or abscess without bleeding: Secondary | ICD-10-CM | POA: Diagnosis not present

## 2022-10-18 DIAGNOSIS — R1900 Intra-abdominal and pelvic swelling, mass and lump, unspecified site: Secondary | ICD-10-CM

## 2022-10-18 DIAGNOSIS — N839 Noninflammatory disorder of ovary, fallopian tube and broad ligament, unspecified: Secondary | ICD-10-CM | POA: Diagnosis not present

## 2022-10-18 DIAGNOSIS — K769 Liver disease, unspecified: Secondary | ICD-10-CM | POA: Diagnosis not present

## 2022-10-18 MED ORDER — IOPAMIDOL (ISOVUE-300) INJECTION 61%
100.0000 mL | Freq: Once | INTRAVENOUS | Status: AC | PRN
  Administered 2022-10-18: 100 mL via INTRAVENOUS

## 2022-10-22 ENCOUNTER — Telehealth: Payer: Self-pay | Admitting: Physician Assistant

## 2022-10-22 NOTE — Telephone Encounter (Signed)
Scheduled appointment per 11/20. Patient is aware of appointment date and time. Patient is aware to arrive 15 mins prior to appointment time and to bring updated insurance cards. Patient is aware of location.

## 2022-10-22 NOTE — Progress Notes (Signed)
[facility] [phone]   [phone]  INITIAL CONSULTATION:  Palmer Care Team: Lavone Orn, MD as PCP - General (Internal Medicine)  CHIEF COMPLAINTS/PURPOSE OF CONSULTATION:  Abnormal CT concerning for malignancy  HISTORY OF PRESENTING ILLNESS: Tonya Palmer 71 y.o. female with medical history significant for hyperlipidemia and hypertension presents to Tonya diagnostic clinic for recent CT imaging concerning for malignancy.  On review of Tonya previous records, Ms. Yohe presented to PCP for annual physical on 10/10/2022. Palmer was reporting RLQ abdominal pain and physical exam findings included purulent area noted on Tonya right quadrant and down to Tonya right mid abdomen tender to palpation.  Palmer underwent abdominal ultrasound, 10/18/2019.  It revealed large solid heterogeneous left renal mass concerning for malignancy with heterogeneous solid vascular mass in Tonya right lower quadrant measuring up to 11.2 cm. CT imaging of Tonya abdomen pelvis on 10/18/2022.  Findings again showed 8.3 cm solid exophytic mass of Tonya left kidney upper pole favoring renal cell carcinoma versus metastatic lesion.  Additionally there was evidence of a 12.4 cm multi lobar heterogeneously enhancing right lower quadrant mesenteric mass, numerous scattered pulmonary nodules and a nonspecific 1.2 cm hypodense lesion inferiorly to Tonya right hepatic lobe.  On exam today, Tonya Palmer is anxious after hearing Tonya news. She doesn't notice any fatigue but has some decreased appetite. She reports an approximately 5 lb weight loss over Tonya last couple of weeks. She denies nausea or vomiting. She reports mild RLQ abdominal discomfort with palpation. She does not require any pain medication at this time. Her bowel habits are unchanged without any recurrent episodes of diarrhea or constipation. She denies easy bruising or signs of active bleeding. She denies fevers,  chills, night sweats, shortness of breath, chest pain, cough, peripheral edema, neuropathy or urinary symptoms. She has no other complaints. Rest of Tonya 10 point ROS is below.   MEDICAL HISTORY:  Past Medical History:  Diagnosis Date   Hyperlipidemia    Hypertension     SURGICAL HISTORY: Past Surgical History:  Procedure Laterality Date   ABDOMINAL HYSTERECTOMY     fibroids    SOCIAL HISTORY: Social History   Socioeconomic History   Marital status: Married    Spouse name: Not on file   Number of children: Not on file   Years of education: Not on file   Highest education level: Not on file  Occupational History   Not on file  Tobacco Use   Smoking status: Never   Smokeless tobacco: Never  Substance and Sexual Activity   Alcohol use: Yes    Comment: once a month   Drug use: Never   Sexual activity: Not on file  Other Topics Concern   Not on file  Social History Narrative   Not on file   Social Determinants of Health   Financial Resource Strain: Not on file  Food Insecurity: Not on file  Transportation Needs: Not on file  Physical Activity: Not on file  Stress: Not on file  Social Connections: Not on file  Intimate Partner Violence: Not on file    FAMILY HISTORY: Family History  Problem Relation Age of Onset   Breast cancer Sister        BRCA positive   Stomach cancer Sister     ALLERGIES:  is allergic to sulfamethoxazole-trimethoprim.  MEDICATIONS:  Current Outpatient Medications  Medication Sig Dispense Refill   atorvastatin (LIPITOR) 10 MG tablet Take 10 mg by mouth daily.  Multiple Vitamin (MULTIVITAMIN) capsule Take 1 capsule by mouth daily. Taking 3 times a week     valsartan-hydrochlorothiazide (DIOVAN-HCT) 160-12.5 MG tablet Take 1 tablet by mouth daily.     No current facility-administered medications for this visit.    REVIEW OF SYSTEMS:   Constitutional: ( - ) fevers, ( - )  chills , ( - ) night sweats Eyes: ( - ) blurriness of  vision, ( - ) double vision, ( - ) watery eyes Ears, nose, mouth, throat, and face: ( - ) mucositis, ( - ) sore throat Respiratory: ( - ) cough, ( - ) dyspnea, ( - ) wheezes Cardiovascular: ( - ) palpitation, ( - ) chest discomfort, ( - ) lower extremity swelling Gastrointestinal:  ( - ) nausea, ( - ) heartburn, ( - ) change in bowel habits Skin: ( - ) abnormal skin rashes Lymphatics: ( - ) new lymphadenopathy, ( - ) easy bruising Neurological: ( - ) numbness, ( - ) tingling, ( - ) new weaknesses Behavioral/Psych: ( - ) mood change, ( - ) new changes  All other systems were reviewed with Tonya Palmer and are negative.  PHYSICAL EXAMINATION: ECOG PERFORMANCE STATUS: 0 - Asymptomatic  Vitals:   10/23/22 1211  BP: (!) 149/81  Pulse: 89  Resp: 17  Temp: 97.7 F (36.5 C)  SpO2: 96%   Filed Weights   10/23/22 1211  Weight: 178 lb 6.4 oz (80.9 kg)    GENERAL: well appearing female in NAD  SKIN: skin color, texture, turgor are normal, no rashes or significant lesions EYES: conjunctiva are pink and non-injected, sclera clear OROPHARYNX: no exudate, no erythema; lips, buccal mucosa, and tongue normal  NECK: supple, non-tender LYMPH:  no palpable lymphadenopathy in Tonya cervical or supraclavicular lymph nodes.  LUNGS: clear to auscultation and percussion with normal breathing effort HEART: regular rate & rhythm and no murmurs and no lower extremity edema ABDOMEN: soft, normal bowel sounds. Palpable nodularity in Tonya RLQ, mild tenderness.  Musculoskeletal: no cyanosis of digits and no clubbing  PSYCH: alert & oriented x 3, fluent speech NEURO: no focal motor/sensory deficits  LABORATORY DATA:  I have reviewed Tonya data as listed    Latest Ref Rng & Units 10/23/2022    2:01 PM 06/25/2007    4:40 AM 06/24/2007    4:12 PM  CBC  WBC 4.0 - 10.5 K/uL 7.2  8.4  8.1   Hemoglobin 12.0 - 15.0 g/dL 13.9  13.4  13.9   Hematocrit 36.0 - 46.0 % 41.2  38.6  40.6   Platelets 150 - 400 K/uL 279   279  309        Latest Ref Rng & Units 10/23/2022    2:01 PM 06/25/2007    4:40 AM  CMP  Glucose 70 - 99 mg/dL 93  94   BUN 8 - 23 mg/dL 17  17   Creatinine 0.44 - 1.00 mg/dL 1.02  0.84   Sodium 135 - 145 mmol/L 139  139   Potassium 3.5 - 5.1 mmol/L 4.5  3.9   Chloride 98 - 111 mmol/L 104  105   CO2 22 - 32 mmol/L 28  26   Calcium 8.9 - 10.3 mg/dL 10.1  9.1   Total Protein 6.5 - 8.1 g/dL 7.4    Total Bilirubin 0.3 - 1.2 mg/dL 0.7    Alkaline Phos 38 - 126 U/L 44    AST 15 - 41 U/L 25    ALT 0 -  44 U/L 17       RADIOGRAPHIC STUDIES: I have personally reviewed Tonya radiological images as listed and agreed with Tonya findings in Tonya report. CT ABDOMEN PELVIS W CONTRAST  Result Date: 10/18/2022 CLINICAL DATA:  Solid left renal mass on ultrasound compatible with malignancy. Solid right lower quadrant mass in Tonya abdomen on ultrasound. Staging workup and further characterization * Tracking Code: BO * EXAM: CT ABDOMEN AND PELVIS WITH CONTRAST TECHNIQUE: Multidetector CT imaging of Tonya abdomen and pelvis was performed using Tonya standard protocol following bolus administration of intravenous contrast. RADIATION DOSE REDUCTION: This exam was performed according to Tonya departmental dose-optimization program which includes automated exposure control, adjustment of the mA and/or kV according to Palmer size and/or use of iterative reconstruction technique. CONTRAST:  173m ISOVUE-300 IOPAMIDOL (ISOVUE-300) INJECTION 61% COMPARISON:  Abdominal ultrasound of 10/17/2022 FINDINGS: Lower chest: Numerous scattered pulmonary nodules in Tonya lung bases concerning for metastatic disease. Index 0.9 by 0.8 cm nodule on image 5 series 5. Hepatobiliary: Nonspecific and indistinct 1.3 cm hypodense lesion inferiorly in Tonya right hepatic lobe on image 30 series 2. Malignancy is not excluded. Gallbladder unremarkable. Pancreas: Unremarkable Spleen: Unremarkable Adrenals/Urinary Tract: Both adrenal glands appear normal.  Solid exophytic 7.8 by 8.3 by 8.2 cm mass of Tonya left kidney upper pole favoring renal cell carcinoma versus less likely metastatic lesion. No definite tumor thrombus in Tonya left renal vein. Right kidney unremarkable. Effaced left kidney upper pole collecting system without hydronephrosis. Stomach/Bowel: Some bowel loops are displaced by Tonya heterogeneously enhancing irregular multilobular 12.4 by 11.7 by 6.1 cm right lower quadrant mesenteric mass. This is not definitively arising from bowel. This lesion abuts Tonya terminal ileum and Tonya appendix is not well seen separate from Tonya mass. Mild sigmoid colon diverticulosis. Vascular/Lymphatic: Atherosclerosis is present, including aortoiliac atherosclerotic disease. No periaortic adenopathy or tumor thrombus in Tonya left renal vein or SVC. Reproductive: Uterus absent. Tonya right lower quadrant mesenteric mass abuts Tonya right ovary. Other: No supplemental non-categorized findings. Musculoskeletal: Sclerosis along Tonya pubic symphysis suggesting mild osteitis pubis. Mild dextroconvex lumbar scoliosis. No definite osseous metastatic lesion identified. Grade 1 degenerative anterolisthesis at L4-5 with lumbar spondylosis and degenerative disc disease resulting in multilevel foraminal impingement. IMPRESSION: 1. 8.3 cm solid exophytic mass of Tonya left kidney upper pole favoring renal cell carcinoma versus less likely metastatic lesion. No tumor thrombus in Tonya left renal vein or SVC. 2. 12.4 cm multilobular heterogeneously enhancing right lower quadrant mesenteric mass. This is not definitively arising from bowel but abuts both bowel and Tonya right ovary. In general I favor this being a metastatic lesion over a primary ovarian or bowel related tumor. 3. Numerous scattered pulmonary nodules in Tonya lung bases suspicious for metastatic disease. CT of Tonya chest is recommended to completely assess Tonya degree of thoracic involvement. Alternatively, PET-CT could be utilized. 4.  Nonspecific 1.3 cm hypodense lesion inferiorly in Tonya right hepatic lobe. Malignancy is not excluded. 5. Mild sigmoid colon diverticulosis. 6. Lumbar spondylosis and degenerative disc disease causing multilevel foraminal impingement. Electronically Signed   By: WVan ClinesM.D.   On: 10/18/2022 12:11    ASSESSMENT & PLAN Tonya PRESASis a 71y.o. female who presents to Tonya diagnostic clinic for recent CT imaging that is concerning for underlying malignancy. I reviewed Tonya CT imaging that shows a 8.3 cm left kidney mass, 12.4 cm RLQ mesenteric mass, 1.3 cm hypodense right liver lesion and numerous scattered pulmonary nodules. Based on CT imaging, we will  request an urgent biopsy of Tonya large RLQ mesenteric mass. Additionally, we will obtain CT chest to complete staging.   #Abnormal CT scan concerning for metastatic disease: --Labs today to check CBC and CMP --STAT biopsy of RLQ mesenteric mass requested --Need CT chest to complete staging --RTC once workup is complete to discuss treatment recommendations.   Orders Placed This Encounter  Procedures   CT Chest W Contrast    Standing Status:   Future    Number of Occurrences:   1    Standing Expiration Date:   10/22/2023    Order Specific Question:   If indicated for Tonya ordered procedure, I authorize Tonya administration of contrast media per Radiology protocol    Answer:   Yes    Order Specific Question:   Does Tonya Palmer have a contrast media/X-ray dye allergy?    Answer:   No    Order Specific Question:   Preferred imaging location?    Answer:   [facility]   CT ABDOMINAL MASS BIOPSY    Standing Status:   Future    Standing Expiration Date:   10/23/2023    Order Specific Question:   Lab orders requested (DO NOT place separate lab orders, these will be automatically ordered during procedure specimen collection):    Answer:   Surgical Pathology    Order Specific Question:   Reason for Exam (SYMPTOM  OR DIAGNOSIS REQUIRED)     Answer:   biopsy large RLQ abdominal mass    Order Specific Question:   Preferred location?    Answer:   [facility]   CBC with Differential ([place] Only)    Standing Status:   Future    Number of Occurrences:   1    Standing Expiration Date:   10/23/2023   CMP ([place] only)    Standing Status:   Future    Number of Occurrences:   1    Standing Expiration Date:   10/23/2023    All questions were answered. Tonya Palmer knows to call Tonya clinic with any problems, questions or concerns.  I have spent a total of 60 minutes minutes of face-to-face and non-face-to-face time, preparing to see Tonya Palmer, obtaining and/or reviewing separately obtained history, performing a medically appropriate examination, counseling and educating Tonya Palmer, ordering tests/procedures, referring and communicating with other health care professionals, documenting clinical information in Tonya electronic health record,  and care coordination.   Dede Query, PA-C Department of Hematology/Oncology [place] at [facility] Phone: [phone]  Palmer was seen with Dr. Lorenso Courier   I have read Tonya above note and personally examined Tonya Palmer. I agree with Tonya assessment and plan as noted above.  Briefly Tonya Palmer is a 71 year old female who presents for evaluation of numerous lesions noted on CT scan concerning for metastatic disease.  CT scan on 10/18/2022 showed an 8.3 cm left kidney mass, 12.4 cm RLQ mesenteric mass, 1.3 cm hypodense right liver lesion and numerous scattered pulmonary nodules.  At this time would recommend a CT scan of Tonya chest in order to complete staging imaging.  Additionally will need an ultrasound-guided biopsy of Tonya right lower quadrant mass.  Once we have obtained complete imaging and a tissue biopsy we will have Tonya Palmer return to clinic to discuss steps moving forward.  Tonya Palmer voiced understanding Tonya plan moving  forward.   Ledell Peoples, MD Department of Hematology/Oncology [facility] at [facility]  Hospital Phone: [phone] Pager: [phone] Email: [email or web address]

## 2022-10-23 ENCOUNTER — Inpatient Hospital Stay: Payer: Medicare HMO | Attending: Physician Assistant | Admitting: Physician Assistant

## 2022-10-23 ENCOUNTER — Inpatient Hospital Stay: Payer: Medicare HMO

## 2022-10-23 ENCOUNTER — Other Ambulatory Visit: Payer: Self-pay

## 2022-10-23 ENCOUNTER — Encounter: Payer: Self-pay | Admitting: Physician Assistant

## 2022-10-23 VITALS — BP 149/81 | HR 89 | Temp 97.7°F | Resp 17 | Ht 65.0 in | Wt 178.4 lb

## 2022-10-23 DIAGNOSIS — N2889 Other specified disorders of kidney and ureter: Secondary | ICD-10-CM | POA: Insufficient documentation

## 2022-10-23 DIAGNOSIS — Z800 Family history of malignant neoplasm of digestive organs: Secondary | ICD-10-CM | POA: Diagnosis not present

## 2022-10-23 DIAGNOSIS — C7989 Secondary malignant neoplasm of other specified sites: Secondary | ICD-10-CM

## 2022-10-23 DIAGNOSIS — R1903 Right lower quadrant abdominal swelling, mass and lump: Secondary | ICD-10-CM | POA: Insufficient documentation

## 2022-10-23 DIAGNOSIS — Z803 Family history of malignant neoplasm of breast: Secondary | ICD-10-CM | POA: Insufficient documentation

## 2022-10-23 DIAGNOSIS — K769 Liver disease, unspecified: Secondary | ICD-10-CM | POA: Insufficient documentation

## 2022-10-23 DIAGNOSIS — R918 Other nonspecific abnormal finding of lung field: Secondary | ICD-10-CM | POA: Insufficient documentation

## 2022-10-23 DIAGNOSIS — C799 Secondary malignant neoplasm of unspecified site: Secondary | ICD-10-CM | POA: Insufficient documentation

## 2022-10-23 DIAGNOSIS — I10 Essential (primary) hypertension: Secondary | ICD-10-CM | POA: Diagnosis not present

## 2022-10-23 LAB — CMP (CANCER CENTER ONLY)
ALT: 17 U/L (ref 0–44)
AST: 25 U/L (ref 15–41)
Albumin: 4.6 g/dL (ref 3.5–5.0)
Alkaline Phosphatase: 44 U/L (ref 38–126)
Anion gap: 7 (ref 5–15)
BUN: 17 mg/dL (ref 8–23)
CO2: 28 mmol/L (ref 22–32)
Calcium: 10.1 mg/dL (ref 8.9–10.3)
Chloride: 104 mmol/L (ref 98–111)
Creatinine: 1.02 mg/dL — ABNORMAL HIGH (ref 0.44–1.00)
GFR, Estimated: 58 mL/min — ABNORMAL LOW (ref >60)
Glucose, Bld: 93 mg/dL (ref 70–99)
Potassium: 4.5 mmol/L (ref 3.5–5.1)
Sodium: 139 mmol/L (ref 135–145)
Total Bilirubin: 0.7 mg/dL (ref 0.3–1.2)
Total Protein: 7.4 g/dL (ref 6.5–8.1)

## 2022-10-23 LAB — CBC WITH DIFFERENTIAL (CANCER CENTER ONLY)
Abs Immature Granulocytes: 0.02 10*3/uL (ref 0.00–0.07)
Basophils Absolute: 0.1 10*3/uL (ref 0.0–0.1)
Basophils Relative: 1 %
Eosinophils Absolute: 0.1 10*3/uL (ref 0.0–0.5)
Eosinophils Relative: 1 %
HCT: 41.2 % (ref 36.0–46.0)
Hemoglobin: 13.9 g/dL (ref 12.0–15.0)
Immature Granulocytes: 0 %
Lymphocytes Relative: 25 %
Lymphs Abs: 1.8 10*3/uL (ref 0.7–4.0)
MCH: 31 pg (ref 26.0–34.0)
MCHC: 33.7 g/dL (ref 30.0–36.0)
MCV: 92 fL (ref 80.0–100.0)
Monocytes Absolute: 0.6 10*3/uL (ref 0.1–1.0)
Monocytes Relative: 8 %
Neutro Abs: 4.7 10*3/uL (ref 1.7–7.7)
Neutrophils Relative %: 65 %
Platelet Count: 279 10*3/uL (ref 150–400)
RBC: 4.48 MIL/uL (ref 3.87–5.11)
RDW: 13.4 % (ref 11.5–15.5)
WBC Count: 7.2 10*3/uL (ref 4.0–10.5)
nRBC: 0 % (ref 0.0–0.2)

## 2022-10-23 NOTE — Progress Notes (Signed)
BIOPSY REVIEW Date: 10/23/22  Requested Biopsy site: RLQ mass  Reason for request: Q met RCC  Imaging review: CT AP, 10/18/22 Recommended imaging modality to perform biopsy: CT, with [place] available in Rm   Please contact me with questions, concerns, or if issue pertaining to this request arise.  Michaelle Birks, MD [facility] Inter[facility]iology [facility]ists [facility] Radiology  Pager. [place]

## 2022-10-23 NOTE — Progress Notes (Signed)
BIOPSY REVIEW Date: 10/23/22  Requested Biopsy site: RLQ mass  Reason for request: Q met RCC  Imaging review: CT AP, 10/18/22 Recommended imaging modality to perform biopsy: CT, with [place] available in Rm   Please contact me with questions, concerns, or if issue pertaining to this request arise.  Michaelle Birks, MD Vascular and Interventional Radiology Specialists [facility] Radiology  Pager. [place]

## 2022-10-24 NOTE — Progress Notes (Signed)
Patient for CT guided RLQ mesenteric mass biopsy w/ poss [place] on Monday 10/29/2022, I called and spoke with the patient on the phone and gave pre-procedure instructions. Pt was made aware to be here at 9:30a at the new entrance, NPO after MN prior to procedure as well as driver post procedure/recovery/discharge. Pt stated understanding. Pt also stated that she does not take aspirin at all and hasn't for some time now.  Called 10/24/2022

## 2022-10-24 NOTE — Progress Notes (Signed)
Mugweru, Wille Glaser, MD  [facility] Procedure Requests BIOPSY REVIEW Date: 10/23/22  Requested Biopsy site: RLQ mass  Reason for request: Q met RCC  Imaging review: CT AP, 10/18/22 Recommended imaging modality to perform biopsy: CT, with [place] available in Rm *Pt will need CBC and PT / INR drawn  Please contact me with questions, concerns, or if issue pertaining to this request arise.  Michaelle Birks, MD Vascular and Interventional Radiology Specialists [facility] Radiology  Pager. [place]

## 2022-10-26 ENCOUNTER — Ambulatory Visit (HOSPITAL_COMMUNITY)
Admission: RE | Admit: 2022-10-26 | Discharge: 2022-10-26 | Disposition: A | Discharge status: Home / Self Care | Payer: Medicare HMO | Source: Ambulatory Visit | Attending: Physician Assistant | Admitting: Physician Assistant

## 2022-10-26 ENCOUNTER — Other Ambulatory Visit: Payer: Self-pay | Admitting: Internal Medicine

## 2022-10-26 DIAGNOSIS — C7989 Secondary malignant neoplasm of other specified sites: Secondary | ICD-10-CM | POA: Diagnosis not present

## 2022-10-26 DIAGNOSIS — R918 Other nonspecific abnormal finding of lung field: Secondary | ICD-10-CM | POA: Diagnosis not present

## 2022-10-26 MED ORDER — IOHEXOL 300 MG/ML  SOLN
75.0000 mL | Freq: Once | INTRAMUSCULAR | Status: AC | PRN
  Administered 2022-10-26: 75 mL via INTRAVENOUS

## 2022-10-28 NOTE — H&P (Signed)
Chief Complaint: RLQ mesenteric mass. Request is for RLQ mass biopsy    Referring Physician(s): Lincoln Brigham  Supervising Physician: Juliet Rude  Patient Status: [facility] - Out-pt  History of Present Illness: Tonya Palmer is a 71 y.o. female inpatient. History of HTN. HLD. Reporting RLQ pain during annual physical. Found to have a left renal mass and a  RLQ mesenteric mass on CT abd pelvis from 11.16.23 reads 12.4 cm multilobular heterogeneously enhancing right lower quadrant mesenteric mass. This is not definitively arising from bowel but abuts both bowel and the right ovary. In general I favor this being a metastatic lesion over a primary ovarian or bowel related tumor. Team is requesting a RLQ mesenteric mass biopsy for further evaluation.  Currently without any significant complaints. Patient alert and laying in bed,calm. Denies any fevers, headache, chest pain, SOB, cough, abdominal pain, nausea, vomiting or bleeding. Return precautions and treatment recommendations and follow-up discussed with the patient  who is agreeable with the plan.    Past Medical History:  Diagnosis Date   Hyperlipidemia    Hypertension     Past Surgical History:  Procedure Laterality Date   ABDOMINAL HYSTERECTOMY     fibroids    Allergies: Sulfamethoxazole-trimethoprim  Medications: Prior to Admission medications   Medication Sig Start Date End Date Taking? Authorizing Provider  atorvastatin (LIPITOR) 10 MG tablet Take 10 mg by mouth daily.    [provider]  Multiple Vitamin (MULTIVITAMIN) capsule Take 1 capsule by mouth daily. Taking 3 times a week    [provider]  valsartan-hydrochlorothiazide (DIOVAN-HCT) 160-12.5 MG tablet Take 1 tablet by mouth daily. 10/07/22   [provider]     Family History  Problem Relation Age of Onset   Breast cancer Sister        BRCA positive   Stomach cancer Sister     Social History   Socioeconomic History    Marital status: Married    Spouse name: Not on file   Number of children: Not on file   Years of education: Not on file   Highest education level: Not on file  Occupational History   Not on file  Tobacco Use   Smoking status: Never   Smokeless tobacco: Never  Vaping Use   Vaping Use: Never used  Substance and Sexual Activity   Alcohol use: Yes    Comment: once a month   Drug use: Never   Sexual activity: Not on file  Other Topics Concern   Not on file  Social History Narrative   Not on file   Social Determinants of Health   Financial Resource Strain: Not on file  Food Insecurity: Not on file  Transportation Needs: Not on file  Physical Activity: Not on file  Stress: Not on file  Social Connections: Not on file     Review of Systems: A 12 point ROS discussed and pertinent positives are indicated in the HPI above.  All other systems are negative.  Review of Systems  Constitutional:  Negative for fatigue and fever.  HENT:  Negative for congestion.   Respiratory:  Negative for cough and shortness of breath.   Gastrointestinal:  Negative for abdominal pain, diarrhea, nausea and vomiting.    Vital Signs: BP (!) 140/73   Pulse 82   Temp 97.8 F (36.6 C) (Oral)   Resp 16   Ht _0  (1.651 m)   Wt 170 lb (77.1 kg)   SpO2 97%   BMI  28.29 kg/m    Physical Exam Vitals and nursing note reviewed.  Constitutional:      Appearance: She is well-developed.  HENT:     Head: Normocephalic and atraumatic.  Eyes:     Conjunctiva/sclera: Conjunctivae normal.  Cardiovascular:     Rate and Rhythm: Normal rate and regular rhythm.     Heart sounds: Normal heart sounds.  Pulmonary:     Effort: Pulmonary effort is normal.     Breath sounds: Normal breath sounds.  Musculoskeletal:        General: Normal range of motion.     Cervical back: Normal range of motion.  Skin:    General: Skin is warm.  Neurological:     Mental Status: She is alert and oriented to person, place,  and time.     Imaging: CT Chest W Contrast  Result Date: 10/29/2022 CLINICAL DATA:  71 year old female with history of left renal mass. Evaluate for metastatic disease. * Tracking Code: BO * EXAM: CT CHEST WITH CONTRAST TECHNIQUE: Multidetector CT imaging of the chest was performed during intravenous contrast administration. RADIATION DOSE REDUCTION: This exam was performed according to the departmental dose-optimization program which includes automated exposure control, adjustment of the mA and/or kV according to patient size and/or use of iterative reconstruction technique. CONTRAST:  4m OMNIPAQUE IOHEXOL 300 MG/ML  SOLN COMPARISON:  No prior chest CT. CT of the abdomen and pelvis 10/18/2022. FINDINGS: Cardiovascular: Heart size is normal. There is no significant pericardial fluid, thickening or pericardial calcification. Atherosclerosis in the thoracic aorta. No definite coronary artery calcifications. Mediastinum/Nodes: No pathologically enlarged mediastinal or hilar lymph nodes. Esophagus is unremarkable in appearance. No axillary lymphadenopathy. Lungs/Pleura: Numerous pulmonary nodules are scattered throughout the lungs bilaterally, concerning for widespread metastatic disease, largest of which is in the lateral segment of the right middle lobe (axial image 73 of series 5) measuring 1.7 x 1.2 cm. No acute consolidative airspace disease. No pleural effusions. Upper Abdomen: Incompletely imaged mass in the upper pole of the left kidney measuring at least 8.4 x 7.9 cm (axial image 156 of series 2), similar to recent CT of the abdomen and pelvis. Musculoskeletal: There are no aggressive appearing lytic or blastic lesions noted in the visualized portions of the skeleton. IMPRESSION: 1. 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 2. No mediastinal or hilar lymphadenopathy. 3. Large left renal mass incompletely imaged. Please see report from recent CT of the abdomen and pelvis 10/18/2022 for full  details. 4. Aortic atherosclerosis. Aortic Atherosclerosis (ICD10-I70.0). Electronically Signed   By: DVinnie LangtonM.D.   On: 10/29/2022 08:18   CT ABDOMEN PELVIS W CONTRAST  Result Date: 10/18/2022 CLINICAL DATA:  Solid left renal mass on ultrasound compatible with malignancy. Solid right lower quadrant mass in the abdomen on ultrasound. Staging workup and further characterization * Tracking Code: BO * EXAM: CT ABDOMEN AND PELVIS WITH CONTRAST TECHNIQUE: Multidetector CT imaging of the abdomen and pelvis was performed using the standard protocol following bolus administration of intravenous contrast. RADIATION DOSE REDUCTION: This exam was performed according to the departmental dose-optimization program which includes automated exposure control, adjustment of the mA and/or kV according to patient size and/or use of iterative reconstruction technique. CONTRAST:  107mISOVUE-300 IOPAMIDOL (ISOVUE-300) INJECTION 61% COMPARISON:  Abdominal ultrasound of 10/17/2022 FINDINGS: Lower chest: Numerous scattered pulmonary nodules in the lung bases concerning for metastatic disease. Index 0.9 by 0.8 cm nodule on image 5 series 5. Hepatobiliary: Nonspecific and indistinct 1.3 cm hypodense lesion inferiorly  in the right hepatic lobe on image 30 series 2. Malignancy is not excluded. Gallbladder unremarkable. Pancreas: Unremarkable Spleen: Unremarkable Adrenals/Urinary Tract: Both adrenal glands appear normal. Solid exophytic 7.8 by 8.3 by 8.2 cm mass of the left kidney upper pole favoring renal cell carcinoma versus less likely metastatic lesion. No definite tumor thrombus in the left renal vein. Right kidney unremarkable. Effaced left kidney upper pole collecting system without hydronephrosis. Stomach/Bowel: Some bowel loops are displaced by the heterogeneously enhancing irregular multilobular 12.4 by 11.7 by 6.1 cm right lower quadrant mesenteric mass. This is not definitively arising from bowel. This lesion abuts  the terminal ileum and the appendix is not well seen separate from the mass. Mild sigmoid colon diverticulosis. Vascular/Lymphatic: Atherosclerosis is present, including aortoiliac atherosclerotic disease. No periaortic adenopathy or tumor thrombus in the left renal vein or SVC. Reproductive: Uterus absent. The right lower quadrant mesenteric mass abuts the right ovary. Other: No supplemental non-categorized findings. Musculoskeletal: Sclerosis along the pubic symphysis suggesting mild osteitis pubis. Mild dextroconvex lumbar scoliosis. No definite osseous metastatic lesion identified. Grade 1 degenerative anterolisthesis at L4-5 with lumbar spondylosis and degenerative disc disease resulting in multilevel foraminal impingement. IMPRESSION: 1. 8.3 cm solid exophytic mass of the left kidney upper pole favoring renal cell carcinoma versus less likely metastatic lesion. No tumor thrombus in the left renal vein or SVC. 2. 12.4 cm multilobular heterogeneously enhancing right lower quadrant mesenteric mass. This is not definitively arising from bowel but abuts both bowel and the right ovary. In general I favor this being a metastatic lesion over a primary ovarian or bowel related tumor. 3. Numerous scattered pulmonary nodules in the lung bases suspicious for metastatic disease. CT of the chest is recommended to completely assess the degree of thoracic involvement. Alternatively, PET-CT could be utilized. 4. Nonspecific 1.3 cm hypodense lesion inferiorly in the right hepatic lobe. Malignancy is not excluded. 5. Mild sigmoid colon diverticulosis. 6. Lumbar spondylosis and degenerative disc disease causing multilevel foraminal impingement. Electronically Signed   By: Van Clines M.D.   On: 10/18/2022 12:11    Labs:  CBC: Recent Labs    10/23/22 1401  WBC 7.2  HGB 13.9  HCT 41.2  PLT 279    COAGS: No results for input(s): "INR", "APTT" in the last 8760 hours.  BMP: Recent Labs    10/23/22 1401  NA  139  K 4.5  CL 104  CO2 28  GLUCOSE 93  BUN 17  CALCIUM 10.1  CREATININE 1.02*  GFRNONAA 58*    LIVER FUNCTION TESTS: Recent Labs    10/23/22 1401  BILITOT 0.7  AST 25  ALT 17  ALKPHOS 44  PROT 7.4  ALBUMIN 4.6     Assessment and Plan:  70 y.o. female inpatient. History of HTN. HLD. Reporting RLQ pain during annual physical. Found to have a left renal mass and a  RLQ mesenteric mass on CT abd pelvis from 11.16.23 reads 12.4 cm multilobular heterogeneously enhancing right lower quadrant mesenteric mass. This is not definitively arising from bowel but abuts both bowel and the right ovary. In general I favor this being a metastatic lesion over a primary ovarian or bowel related tumor. Team is requesting a RLQ mesenteric mass biopsy for further evaluation.   Labs from 11.21.23 show Cr 1.02. GFR < 58. All other labs and medications are within acceptable parameters.  Allergies include bactrim. Patient has been NPO since midnight.   Risks and benefits of RLQ mesenteric mass was discussed with the  patient and/or patient's family including, but not limited to bleeding, infection, damage to adjacent structures or low yield requiring additional tests.  All of the questions were answered and there is agreement to proceed.  Consent signed and in chart.   Thank you for this interesting consult.  I greatly enjoyed meeting Tonya Palmer and look forward to participating in their care.  A copy of this report was sent to the requesting provider on this date.  Electronically Signed: Jacqualine Mau, NP 10/29/2022, 10:13 AM   I spent a total of  30 Minutes   in face to face in clinical consultation, greater than 50% of which was counseling/coordinating care for RLQ mesenteric mass.

## 2022-10-29 ENCOUNTER — Ambulatory Visit
Admission: RE | Admit: 2022-10-29 | Discharge: 2022-10-29 | Disposition: A | Discharge status: Home / Self Care | Payer: Medicare HMO | Source: Ambulatory Visit | Attending: Physician Assistant | Admitting: Physician Assistant

## 2022-10-29 DIAGNOSIS — I10 Essential (primary) hypertension: Secondary | ICD-10-CM | POA: Insufficient documentation

## 2022-10-29 DIAGNOSIS — D4819 Other specified neoplasm of uncertain behavior of connective and other soft tissue: Secondary | ICD-10-CM | POA: Diagnosis not present

## 2022-10-29 DIAGNOSIS — R1900 Intra-abdominal and pelvic swelling, mass and lump, unspecified site: Secondary | ICD-10-CM | POA: Diagnosis not present

## 2022-10-29 DIAGNOSIS — E785 Hyperlipidemia, unspecified: Secondary | ICD-10-CM | POA: Insufficient documentation

## 2022-10-29 DIAGNOSIS — R1031 Right lower quadrant pain: Secondary | ICD-10-CM | POA: Insufficient documentation

## 2022-10-29 DIAGNOSIS — R1903 Right lower quadrant abdominal swelling, mass and lump: Secondary | ICD-10-CM | POA: Diagnosis not present

## 2022-10-29 DIAGNOSIS — C7989 Secondary malignant neoplasm of other specified sites: Secondary | ICD-10-CM

## 2022-10-29 DIAGNOSIS — C762 Malignant neoplasm of abdomen: Secondary | ICD-10-CM | POA: Diagnosis not present

## 2022-10-29 LAB — PROTIME-INR
INR: 1 (ref 0.8–1.2)
Prothrombin Time: 13.4 seconds (ref 11.4–15.2)

## 2022-10-29 LAB — CBC WITH DIFFERENTIAL/PLATELET
Abs Immature Granulocytes: 0.02 10*3/uL (ref 0.00–0.07)
Basophils Absolute: 0 10*3/uL (ref 0.0–0.1)
Basophils Relative: 1 %
Eosinophils Absolute: 0.1 10*3/uL (ref 0.0–0.5)
Eosinophils Relative: 2 %
HCT: 41.6 % (ref 36.0–46.0)
Hemoglobin: 13.9 g/dL (ref 12.0–15.0)
Immature Granulocytes: 0 %
Lymphocytes Relative: 27 %
Lymphs Abs: 1.4 10*3/uL (ref 0.7–4.0)
MCH: 30 pg (ref 26.0–34.0)
MCHC: 33.4 g/dL (ref 30.0–36.0)
MCV: 89.8 fL (ref 80.0–100.0)
Monocytes Absolute: 0.5 10*3/uL (ref 0.1–1.0)
Monocytes Relative: 10 %
Neutro Abs: 3.2 10*3/uL (ref 1.7–7.7)
Neutrophils Relative %: 60 %
Platelets: 268 10*3/uL (ref 150–400)
RBC: 4.63 MIL/uL (ref 3.87–5.11)
RDW: 13.4 % (ref 11.5–15.5)
WBC: 5.3 10*3/uL (ref 4.0–10.5)
nRBC: 0 % (ref 0.0–0.2)

## 2022-10-29 MED ORDER — FENTANYL CITRATE (PF) 100 MCG/2ML IJ SOLN
INTRAMUSCULAR | Status: AC | PRN
  Administered 2022-10-29: 25 ug via INTRAVENOUS
  Administered 2022-10-29: 50 ug via INTRAVENOUS

## 2022-10-29 MED ORDER — SODIUM CHLORIDE 0.9 % IV SOLN
INTRAVENOUS | Status: DC
  Administered 2022-10-29: 1000 mL via INTRAVENOUS

## 2022-10-29 MED ORDER — MIDAZOLAM HCL 5 MG/5ML IJ SOLN
INTRAMUSCULAR | Status: AC | PRN
  Administered 2022-10-29: .5 mg via INTRAVENOUS

## 2022-10-29 MED ORDER — MIDAZOLAM HCL 2 MG/2ML IJ SOLN
INTRAMUSCULAR | Status: AC
  Filled 2022-10-29: qty 2

## 2022-10-29 MED ORDER — MIDAZOLAM HCL 2 MG/2ML IJ SOLN
INTRAMUSCULAR | Status: AC | PRN
  Administered 2022-10-29: 1 mg via INTRAVENOUS

## 2022-10-29 MED ORDER — LIDOCAINE HCL (PF) 1 % IJ SOLN
10.0000 mL | Freq: Once | INTRAMUSCULAR | Status: AC
  Administered 2022-10-29: 10 mL via INTRADERMAL

## 2022-10-29 MED ORDER — FENTANYL CITRATE (PF) 100 MCG/2ML IJ SOLN
INTRAMUSCULAR | Status: AC
  Filled 2022-10-29: qty 2

## 2022-10-29 NOTE — Procedures (Signed)
Interventional Radiology Procedure Note  Date of Procedure: 10/29/2022  Procedure: CT abdominal mass biopsy   Findings:  1. CT guided RLQ abdominal mass biopsy 18ga x6 passes    Complications: No immediate complications noted.   Estimated Blood Loss: minimal  Follow-up and Recommendations: 1. Bedrest 1 hour    Albin Felling, MD  Vascular & Interventional Radiology  10/29/2022 12:16 PM

## 2022-10-29 NOTE — Progress Notes (Signed)
Patient clinically stable post CT Abdominal LN biopsy per DR El-Abd, tolerated well. Denies complaints at present time. Received Versed 1.5 mg along with fentanyl 75 mcg IV for procedure. Discharge instructions given to patient and husband with questions answered.

## 2022-10-31 LAB — SURGICAL PATHOLOGY

## 2022-11-01 ENCOUNTER — Telehealth: Payer: Self-pay | Admitting: Hematology and Oncology

## 2022-11-01 NOTE — Telephone Encounter (Signed)
Called Ms. Tonya Palmer to discuss the results of her biopsy.  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  Given the low-grade of this tumor it is possible this is a second primary rather than the cause of her metastatic lung disease.  Case is currently being discussed by Dr. Alvy Bimler and the GYN Onc service.  Based on the recommendations we decide the next best steps moving forward (including possible lung biopsy and urology involvement).  The patient voiced understanding of our plan and of the biopsy results.  Ledell Peoples, MD Department of Hematology/Oncology [place] [facility] Phone: [phone] Pager: [phone] Email: [email or web address]

## 2022-11-02 ENCOUNTER — Other Ambulatory Visit: Payer: Self-pay | Admitting: Physician Assistant

## 2022-11-03 ENCOUNTER — Other Ambulatory Visit: Payer: Self-pay | Admitting: Physician Assistant

## 2022-11-03 DIAGNOSIS — R918 Other nonspecific abnormal finding of lung field: Secondary | ICD-10-CM

## 2022-11-03 DIAGNOSIS — N2889 Other specified disorders of kidney and ureter: Secondary | ICD-10-CM

## 2022-11-05 ENCOUNTER — Telehealth: Payer: Self-pay | Admitting: Pulmonary Disease

## 2022-11-05 ENCOUNTER — Telehealth: Payer: Self-pay

## 2022-11-05 ENCOUNTER — Encounter: Payer: Self-pay | Admitting: General Practice

## 2022-11-05 ENCOUNTER — Other Ambulatory Visit: Payer: Self-pay | Admitting: Physician Assistant

## 2022-11-05 DIAGNOSIS — R918 Other nonspecific abnormal finding of lung field: Secondary | ICD-10-CM | POA: Insufficient documentation

## 2022-11-05 NOTE — Telephone Encounter (Signed)
PCCM:  I called the patient regarding her CT chest results.   Patient was referred by oncology for urgent biopsy consideration.  I do have an opening tomorrow 12/5 at [facility] for bronchoscopy and biopsy.  She is not on any blood thinners or antiplatelets.  She would however need to ensure insurance authorization.  Our PCC's have already left for the day.  If by some chance we are able to ensure this for as early as tomorrow we could do her procedure tomorrow afternoon??? I know that is a long shot but it is worth entertaining the possibility to expedite for the patient.   The backup plan however would be to schedule this for the following Tuesday 11/13/2022 if we are unable to ensure the necessary paperwork completion before tomorrow.  Which is reasonable considering we are trying to do this day off.  Patient is understanding of this and understands that if we are unable to obtain it for tomorrow that we will be scheduled for the following Tuesday on 11/13/2022.   [place] [facility] 11/05/2022 5:22 PM

## 2022-11-05 NOTE — Progress Notes (Signed)
BIOPSY REVIEW Date: 11/05/22  Requested Biopsy site: Lung   Reason for request: Nodule  Imaging review: CT Chest, 10/26/22 Recommended imaging modality to perform biopsy: Declined / deferred. Consider bronchoscopic Bx.  Please contact me with questions, concerns, or if issue pertaining to this request arise.  Michaelle Birks, MD Vascular and Interventional Radiology Specialists [facility] Radiology  Pager. [place]

## 2022-11-05 NOTE — Telephone Encounter (Signed)
can you fax over a STAT referral to urology   STAT referral faxed to [facility].  Confirmation received

## 2022-11-06 ENCOUNTER — Other Ambulatory Visit: Payer: Self-pay

## 2022-11-06 ENCOUNTER — Ambulatory Visit (HOSPITAL_COMMUNITY)
Admission: RE | Admit: 2022-11-06 | Discharge: 2022-11-06 | Disposition: A | Discharge status: Home / Self Care | Payer: Medicare HMO | Source: Other Acute Inpatient Hospital | Attending: Pulmonary Disease | Admitting: Pulmonary Disease

## 2022-11-06 ENCOUNTER — Ambulatory Visit (HOSPITAL_COMMUNITY): Payer: Medicare HMO | Admitting: Anesthesiology

## 2022-11-06 ENCOUNTER — Ambulatory Visit (HOSPITAL_BASED_OUTPATIENT_CLINIC_OR_DEPARTMENT_OTHER): Payer: Medicare HMO | Admitting: Anesthesiology

## 2022-11-06 ENCOUNTER — Encounter (HOSPITAL_COMMUNITY): Admission: RE | Disposition: A | Discharge status: Home / Self Care | Payer: Self-pay | Attending: Pulmonary Disease

## 2022-11-06 ENCOUNTER — Encounter (HOSPITAL_COMMUNITY): Payer: Self-pay | Admitting: Pulmonary Disease

## 2022-11-06 ENCOUNTER — Ambulatory Visit (HOSPITAL_COMMUNITY): Payer: Medicare HMO

## 2022-11-06 DIAGNOSIS — Z79899 Other long term (current) drug therapy: Secondary | ICD-10-CM | POA: Insufficient documentation

## 2022-11-06 DIAGNOSIS — E785 Hyperlipidemia, unspecified: Secondary | ICD-10-CM | POA: Insufficient documentation

## 2022-11-06 DIAGNOSIS — I10 Essential (primary) hypertension: Secondary | ICD-10-CM | POA: Diagnosis not present

## 2022-11-06 DIAGNOSIS — R918 Other nonspecific abnormal finding of lung field: Secondary | ICD-10-CM | POA: Diagnosis present

## 2022-11-06 DIAGNOSIS — R911 Solitary pulmonary nodule: Secondary | ICD-10-CM | POA: Diagnosis not present

## 2022-11-06 DIAGNOSIS — R846 Abnormal cytological findings in specimens from respiratory organs and thorax: Secondary | ICD-10-CM | POA: Diagnosis not present

## 2022-11-06 DIAGNOSIS — Z1152 Encounter for screening for COVID-19: Secondary | ICD-10-CM | POA: Diagnosis not present

## 2022-11-06 HISTORY — PX: BRONCHIAL BRUSHINGS: SHX5108

## 2022-11-06 HISTORY — PX: BRONCHIAL NEEDLE ASPIRATION BIOPSY: SHX5106

## 2022-11-06 HISTORY — PX: BRONCHIAL BIOPSY: SHX5109

## 2022-11-06 LAB — SARS CORONAVIRUS 2 BY RT PCR: SARS Coronavirus 2 by RT PCR: NEGATIVE

## 2022-11-06 SURGERY — BRONCHOSCOPY, WITH BIOPSY USING ELECTROMAGNETIC NAVIGATION
Anesthesia: General | Laterality: Bilateral

## 2022-11-06 MED ORDER — CHLORHEXIDINE GLUCONATE 0.12 % MT SOLN
OROMUCOSAL | Status: AC
  Administered 2022-11-06: 15 mL via OROMUCOSAL
  Filled 2022-11-06: qty 15

## 2022-11-06 MED ORDER — SUGAMMADEX SODIUM 200 MG/2ML IV SOLN
INTRAVENOUS | Status: DC | PRN
  Administered 2022-11-06: 200 mg via INTRAVENOUS

## 2022-11-06 MED ORDER — PROPOFOL 10 MG/ML IV BOLUS
INTRAVENOUS | Status: DC | PRN
  Administered 2022-11-06: 150 mg via INTRAVENOUS
  Administered 2022-11-06: 20 mg via INTRAVENOUS
  Administered 2022-11-06: 30 mg via INTRAVENOUS

## 2022-11-06 MED ORDER — KETOROLAC TROMETHAMINE 0.5 % OP SOLN
OPHTHALMIC | Status: AC
  Filled 2022-11-06: qty 5

## 2022-11-06 MED ORDER — ONDANSETRON HCL 4 MG/2ML IJ SOLN
INTRAMUSCULAR | Status: DC | PRN
  Administered 2022-11-06: 4 mg via INTRAVENOUS

## 2022-11-06 MED ORDER — DEXAMETHASONE SODIUM PHOSPHATE 10 MG/ML IJ SOLN
INTRAMUSCULAR | Status: DC | PRN
  Administered 2022-11-06: 4 mg via INTRAVENOUS

## 2022-11-06 MED ORDER — CHLORHEXIDINE GLUCONATE 0.12 % MT SOLN
15.0000 mL | OROMUCOSAL | Status: AC
  Filled 2022-11-06: qty 15

## 2022-11-06 MED ORDER — FENTANYL CITRATE (PF) 100 MCG/2ML IJ SOLN: 25.0000 ug | INTRAMUSCULAR | Status: DC | PRN

## 2022-11-06 MED ORDER — KETOROLAC TROMETHAMINE 0.5 % OP SOLN: 1.0000 [drp] | Freq: Four times a day (QID) | OPHTHALMIC | Status: DC

## 2022-11-06 MED ORDER — ROCURONIUM BROMIDE 10 MG/ML (PF) SYRINGE
PREFILLED_SYRINGE | INTRAVENOUS | Status: DC | PRN
  Administered 2022-11-06: 50 mg via INTRAVENOUS
  Administered 2022-11-06: 10 mg via INTRAVENOUS

## 2022-11-06 MED ORDER — ACETAMINOPHEN 500 MG PO TABS
ORAL_TABLET | ORAL | Status: AC
  Administered 2022-11-06: 500 mg via ORAL
  Filled 2022-11-06: qty 2

## 2022-11-06 MED ORDER — BSS IO SOLN
15.0000 mL | Freq: Once | INTRAOCULAR | Status: DC
  Filled 2022-11-06: qty 15

## 2022-11-06 MED ORDER — KETOROLAC TROMETHAMINE 0.5 % OP SOLN
1.0000 [drp] | Freq: Four times a day (QID) | OPHTHALMIC | Status: DC
  Filled 2022-11-06 (×2): qty 3

## 2022-11-06 MED ORDER — ACETAMINOPHEN 500 MG PO TABS: 1000.0000 mg | ORAL_TABLET | Freq: Once | ORAL | Status: AC

## 2022-11-06 MED ORDER — DEXMEDETOMIDINE HCL IN NACL 80 MCG/20ML IV SOLN
INTRAVENOUS | Status: DC | PRN
  Administered 2022-11-06 (×2): 8 ug via BUCCAL
  Administered 2022-11-06: 4 ug via BUCCAL

## 2022-11-06 MED ORDER — LIDOCAINE 2% (20 MG/ML) 5 ML SYRINGE
INTRAMUSCULAR | Status: DC | PRN
  Administered 2022-11-06: 60 mg via INTRAVENOUS

## 2022-11-06 MED ORDER — BSS IO SOLN
15.0000 mL | Freq: Once | INTRAOCULAR | Status: DC
  Filled 2022-11-06 (×2): qty 15

## 2022-11-06 MED ORDER — LACTATED RINGERS IV SOLN: INTRAVENOUS | Status: DC

## 2022-11-06 MED ORDER — PROPOFOL 500 MG/50ML IV EMUL
INTRAVENOUS | Status: DC | PRN
  Administered 2022-11-06: 100 ug/kg/min via INTRAVENOUS

## 2022-11-06 NOTE — Telephone Encounter (Signed)
Procedure has been scheduled for 1330 11/06/22

## 2022-11-06 NOTE — Discharge Instructions (Signed)
Flexible Bronchoscopy, Care After This sheet gives you information about how to care for yourself after your test. Your doctor may also give you more specific instructions. If you have problems or questions, contact your doctor. Follow these instructions at home: Eating and drinking Do not eat or drink anything (not even water) for 2 hours after your test, or until your numbing medicine (local anesthetic) wears off. When your numbness is gone and your cough and gag reflexes have come back, you may: Eat only soft foods. Slowly drink liquids. The day after the test, go back to your normal diet. Driving Do not drive for 24 hours if you were given a medicine to help you relax (sedative). Do not drive or use heavy machinery while taking prescription pain medicine. General instructions  Take over-the-counter and prescription medicines only as told by your doctor. Return to your normal activities as told. Ask what activities are safe for you. Do not use any products that have nicotine or tobacco in them. This includes cigarettes and e-cigarettes. If you need help quitting, ask your doctor. Keep all follow-up visits as told by your doctor. This is important. It is very important if you had a tissue sample (biopsy) taken. Get help right away if: You have shortness of breath that gets worse. You get light-headed. You feel like you are going to pass out (faint). You have chest pain. You cough up: More than a little blood. More blood than before. Summary Do not eat or drink anything (not even water) for 2 hours after your test, or until your numbing medicine wears off. Do not use cigarettes. Do not use e-cigarettes. Get help right away if you have chest pain.  This information is not intended to replace advice given to you by your health care provider. Make sure you discuss any questions you have with your health care provider. Document Released: 09/16/2009 Document Revised: 11/01/2017 Document  Reviewed: 12/07/2016 Elsevier Patient Education  2020 [employer].

## 2022-11-06 NOTE — Telephone Encounter (Signed)
Bronch has been scheduled for today and pt has been contacted.

## 2022-11-06 NOTE — Interval H&P Note (Signed)
History and Physical Interval Note:  11/06/2022 12:07 PM  Tonya Palmer  has presented today for surgery, with the diagnosis of lung nodules.  The various methods of treatment have been discussed with the patient and family. After consideration of risks, benefits and other options for treatment, the patient has consented to  Procedure(s): ROBOTIC ASSISTED NAVIGATIONAL BRONCHOSCOPY (Bilateral) as a surgical intervention.  The patient's history has been reviewed, patient examined, no change in status, stable for surgery.  I have reviewed the patient's chart and labs.  Questions were answered to the patient's satisfaction.     [place]

## 2022-11-06 NOTE — Op Note (Signed)
Video Bronchoscopy with Robotic Assisted Bronchoscopic Navigation   Date of Operation: 11/06/2022   Pre-op Diagnosis: Multiple pulmonary nodules  Post-op Diagnosis: Multiple pulmonary nodules  Surgeon: Garner Nash, DO   Assistants: None   Anesthesia: General endotracheal anesthesia  Operation: Flexible video fiberoptic bronchoscopy with robotic assistance and biopsies.  Estimated Blood Loss: Minimal  Complications: None  Indications and History: Tonya Palmer is a 71 y.o. female with history of multiple pulmonary nodules. The risks, benefits, complications, treatment options and expected outcomes were discussed with the patient.  The possibilities of pneumothorax, pneumonia, reaction to medication, pulmonary aspiration, perforation of a viscus, bleeding, failure to diagnose a condition and creating a complication requiring transfusion or operation were discussed with the patient who freely signed the consent.    Description of Procedure: The patient was seen in the Preoperative Area, was examined and was deemed appropriate to proceed.  The patient was taken to [facility] endoscopy room 3, identified as Hillery Jacks and the procedure verified as Flexible Video Fiberoptic Bronchoscopy.  A Time Out was held and the above information confirmed.   Prior to the date of the procedure a high-resolution CT scan of the chest was performed. Utilizing ION software program a virtual tracheobronchial tree was generated to allow the creation of distinct navigation pathways to the patient's parenchymal abnormalities. After being taken to the operating room general anesthesia was initiated and the patient  was orally intubated. The video fiberoptic bronchoscope was introduced via the endotracheal tube and a general inspection was performed which showed normal right and left lung anatomy, aspiration of the bilateral mainstems was completed to remove any remaining secretions. Robotic catheter inserted into  patient's endotracheal tube.   Target #1 RML nodule: The distinct navigation pathways prepared prior to this procedure were then utilized to navigate to patient's lesion identified on CT scan. The robotic catheter was secured into place and the vision probe was withdrawn.  Lesion location was approximated using fluoroscopy and three-dimensional cone beam CT imaging. Under fluoroscopic guidance transbronchial needle brushings, transbronchial needle biopsies, and transbronchial forceps biopsies were performed to be sent for cytology and pathology.   At the end of the procedure a general airway inspection was performed and there was no evidence of active bleeding. The bronchoscope was removed.  The patient tolerated the procedure well. There was no significant blood loss and there were no obvious complications. A post-procedural chest x-ray is pending.  Samples Target #1: 1. Transbronchial needle brushings from RML 2. Transbronchial Wang needle biopsies from RML 3. Transbronchial forceps biopsies from RML  Plans:  The patient will be discharged from the PACU to home when recovered from anesthesia and after chest x-ray is reviewed. We will review the cytology, pathology results with the patient when they become available. Outpatient followup will be with Garner Nash, DO.   Garner Nash, DO [place] [facility] 11/06/2022 2:37 PM

## 2022-11-06 NOTE — Anesthesia Preprocedure Evaluation (Addendum)
Anesthesia Evaluation  Patient identified by MRN, date of birth, ID band Patient awake    Reviewed: Allergy & Precautions, H&P , NPO status , Patient's Chart, lab work & pertinent test results  Airway Mallampati: II  TM Distance: >3 FB Neck ROM: Full    Dental no notable dental hx. (+) Teeth Intact, Dental Advisory Given   Pulmonary neg pulmonary ROS   Pulmonary exam normal breath sounds clear to auscultation       Cardiovascular hypertension, Pt. on medications  Rhythm:Regular Rate:Normal     Neuro/Psych negative neurological ROS  negative psych ROS   GI/Hepatic negative GI ROS, Neg liver ROS,,,  Endo/Other  negative endocrine ROS    Renal/GU negative Renal ROS  negative genitourinary   Musculoskeletal   Abdominal   Peds  Hematology negative hematology ROS (+)   Anesthesia Other Findings   Reproductive/Obstetrics negative OB ROS                             Anesthesia Physical Anesthesia Plan  ASA: 2  Anesthesia Plan: General   Post-op Pain Management: Tylenol PO (pre-op)*   Induction: Intravenous  PONV Risk Score and Plan: 4 or greater and Ondansetron, Dexamethasone, Propofol infusion and TIVA  Airway Management Planned: Oral ETT  Additional Equipment:   Intra-op Plan:   Post-operative Plan: Extubation in OR  Informed Consent: I have reviewed the patients History and Physical, chart, labs and discussed the procedure including the risks, benefits and alternatives for the proposed anesthesia with the patient or authorized representative who has indicated his/her understanding and acceptance.     Dental advisory given  Plan Discussed with: CRNA  Anesthesia Plan Comments:        Anesthesia Quick Evaluation

## 2022-11-06 NOTE — H&P (Signed)
Synopsis: Referred in Dec. 2023 for Lung nodules by No ref. provider found  Subjective:   PATIENT ID: Tonya Palmer GENDER: female DOB: [birth year], MRN: [id number]   This is a 71 year old female, past medical history of hypertension, hyperlipidemia, reporting of right lower quadrant pain found to have a left renal mass and a left lower quadrant quadrant mesenteric mass.  She underwent interventional radiology biopsy there was concern for possible leiomyosarcoma.  Also had CT radiographic evidence of lesion that potentially arises from the right ovary.  But in general they favored metastatic lesion over a primary ovarian mass.  She had images reviewed by interventional radiology and recommendation was for consideration of robotic assisted navigational bronchoscopy tissue sampling of the multiple pulmonary nodules within the chest to confirm metastatic disease which potentially could be from underlying renal malignancy.  I received a urgent referral yesterday.  I called spoke with the patient last night.  Thankfully our office staff and endoscopy were able to accommodate scheduling.      Past Medical History:  Diagnosis Date   Hyperlipidemia    Hypertension      Family History  Problem Relation Age of Onset   Breast cancer Sister        BRCA positive   Stomach cancer Sister      Past Surgical History:  Procedure Laterality Date   ABDOMINAL HYSTERECTOMY     Partial. Bilateral tubes in place    Social History   Socioeconomic History   Marital status: Married    Spouse name: Not on file   Number of children: Not on file   Years of education: Not on file   Highest education level: Not on file  Occupational History   Not on file  Tobacco Use   Smoking status: Never   Smokeless tobacco: Never  Vaping Use   Vaping Use: Never used  Substance and Sexual Activity   Alcohol use: Yes    Comment: once a month   Drug use: Never   Sexual activity: Not on file  Other Topics  Concern   Not on file  Social History Narrative   Not on file   Social Determinants of Health   Financial Resource Strain: Not on file  Food Insecurity: Not on file  Transportation Needs: Not on file  Physical Activity: Not on file  Stress: Not on file  Social Connections: Not on file  Intimate Partner Violence: Not on file     Allergies  Allergen Reactions   Sulfamethoxazole-Trimethoprim Rash     _0 @  Review of Systems  Constitutional:  Positive for weight loss. Negative for chills, fever and malaise/fatigue.  HENT:  Negative for hearing loss, sore throat and tinnitus.   Eyes:  Negative for blurred vision and double vision.  Respiratory:  Negative for cough, hemoptysis, sputum production, shortness of breath, wheezing and stridor.   Cardiovascular:  Negative for chest pain, palpitations, orthopnea, leg swelling and PND.  Gastrointestinal:  Negative for abdominal pain, constipation, diarrhea, heartburn, nausea and vomiting.  Genitourinary:  Negative for dysuria, hematuria and urgency.  Musculoskeletal:  Negative for joint pain and myalgias.  Skin:  Negative for itching and rash.  Neurological:  Negative for dizziness, tingling, weakness and headaches.  Endo/Heme/Allergies:  Negative for environmental allergies. Does not bruise/bleed easily.  Psychiatric/Behavioral:  Negative for depression. The patient is not nervous/anxious and does not have insomnia.   All other systems reviewed and are negative.    Objective:  Physical Exam Vitals reviewed.  Constitutional:      General: She is not in acute distress.    Appearance: She is well-developed.  HENT:     Head: Normocephalic and atraumatic.  Eyes:     General: No scleral icterus.    Conjunctiva/sclera: Conjunctivae normal.     Pupils: Pupils are equal, round, and reactive to light.  Neck:     Vascular: No JVD.     Trachea: No tracheal deviation.  Cardiovascular:     Rate and Rhythm: Normal rate and  regular rhythm.     Heart sounds: Normal heart sounds. No murmur heard. Pulmonary:     Effort: Pulmonary effort is normal. No tachypnea, accessory muscle usage or respiratory distress.     Breath sounds: No stridor. No wheezing, rhonchi or rales.  Abdominal:     General: There is no distension.     Palpations: Abdomen is soft.     Tenderness: There is no abdominal tenderness.  Musculoskeletal:        General: No tenderness.     Cervical back: Neck supple.  Lymphadenopathy:     Cervical: No cervical adenopathy.  Skin:    General: Skin is warm and dry.     Capillary Refill: Capillary refill takes less than 2 seconds.     Findings: No rash.  Neurological:     Mental Status: She is alert and oriented to person, place, and time.  Psychiatric:        Behavior: Behavior normal.      Vitals:   11/06/22 1104  BP: 131/74  Pulse: 80  Resp: 18  Temp: 97.6 F (36.4 C)  TempSrc: Oral  SpO2: 95%  Weight: 77.1 kg  Height: _0  (1.651 m)   95% on RA BMI Readings from Last 3 Encounters:  11/06/22 28.29 kg/m  10/29/22 28.29 kg/m  10/23/22 29.69 kg/m   Wt Readings from Last 3 Encounters:  11/06/22 77.1 kg  10/29/22 77.1 kg  10/23/22 80.9 kg     CBC    Component Value Date/Time   WBC 5.3 10/29/2022 1015   RBC 4.63 10/29/2022 1015   HGB 13.9 10/29/2022 1015   HGB 13.9 10/23/2022 1401   HCT 41.6 10/29/2022 1015   PLT 268 10/29/2022 1015   PLT 279 10/23/2022 1401   MCV 89.8 10/29/2022 1015   MCH 30.0 10/29/2022 1015   MCHC 33.4 10/29/2022 1015   RDW 13.4 10/29/2022 1015   LYMPHSABS 1.4 10/29/2022 1015   MONOABS 0.5 10/29/2022 1015   EOSABS 0.1 10/29/2022 1015   BASOSABS 0.0 10/29/2022 1015     Chest Imaging: CT chest 10/26/2022: Patient found to have multiple round lesions within the chest concerning for metastatic disease to the lung. The patient's images have been independently reviewed by me.    Pulmonary Functions Testing Results:     No data to display           FeNO:   Pathology:  Echocardiogram:   Heart Catheterization:     Assessment & Plan:    Multiple pulmonary nodules Concern for metastatic disease to the lung Pelvic mass, renal mass  Discussion: Today we met in preop and discussed risk-benefit alternatives of proceeding with robotic assisted navigational bronchoscopy. We talked at the risk of bleeding and pneumothorax . We also talked about the increased risk of bleeding associated with renal cell malignancy if in fact we are dealing with this.  There may be a slight 1 to 2% increase of major bleeding events that are associated  with these malignancies as they are angioinvasive.  We talked about the risk of pneumothorax and being less than 2%.  Versus having a transthoracic needle approach and She was referred for bronchoscopy versus referral to interventional radiology.  We discussed and answered all of her questions.  Patient is agreeable to proceed.    Current Facility-Administered Medications:    lactated ringers infusion, , Intravenous, Continuous, Roderic Palau, MD, Last Rate: 10 mL/hr at 11/06/22 1137, New Bag at 11/06/22 1137  I spent 62 minutes dedicated to the care of this patient on the date of this encounter to include pre-visit review of records, face-to-face time with the patient discussing conditions above, post visit ordering of testing, clinical documentation with the electronic health record, making appropriate referrals as documented, and communicating necessary findings to members of the patients care team.   Garner Nash, DO [facility] 11/06/2022 11:59 AM

## 2022-11-06 NOTE — Anesthesia Procedure Notes (Signed)
Procedure Name: Intubation Date/Time: 11/06/2022 1:00 PM  Performed by: Wilburn Cornelia, CRNAPre-anesthesia Checklist: Patient identified, Emergency Drugs available, Suction available, Patient being monitored and Timeout performed Patient Re-evaluated:Patient Re-evaluated prior to induction Oxygen Delivery Method: Circle system utilized Preoxygenation: Pre-oxygenation with 100% oxygen Induction Type: IV induction Ventilation: Mask ventilation without difficulty Laryngoscope Size: Mac and 3 Grade View: Grade II Tube type: Oral Tube size: 8.5 mm Number of attempts: 1 Airway Equipment and Method: Stylet Placement Confirmation: ETT inserted through vocal cords under direct vision, positive ETCO2, CO2 detector and breath sounds checked- equal and bilateral Secured at: 21 cm Tube secured with: Tape Dental Injury: Teeth and Oropharynx as per pre-operative assessment

## 2022-11-06 NOTE — Transfer of Care (Signed)
Immediate Anesthesia Transfer of Care Note  Patient: Tonya Palmer  Procedure(s) Performed: ROBOTIC ASSISTED NAVIGATIONAL BRONCHOSCOPY (Bilateral) BRONCHIAL NEEDLE ASPIRATION BIOPSIES BRONCHIAL BRUSHINGS BRONCHIAL BIOPSIES  Patient Location: PACU  Anesthesia Type:General  Level of Consciousness: awake, alert , and oriented  Airway & Oxygen Therapy: Patient Spontanous Breathing and Patient connected to nasal cannula oxygen  Post-op Assessment: Report given to RN and Post -op Vital signs reviewed and stable  Post vital signs: Reviewed and stable  Last Vitals:  Vitals Value Taken Time  BP 124/74 11/06/22 1422  Temp 37.4 C 11/06/22 1422  Pulse 82 11/06/22 1426  Resp 12 11/06/22 1426  SpO2 94 % 11/06/22 1426  Vitals shown include unvalidated device data.  Last Pain:  Vitals:   11/06/22 1422  TempSrc:   PainSc: 0-No pain      Patients Stated Pain Goal: 0 (33/43/56 8616)  Complications: No notable events documented.

## 2022-11-07 DIAGNOSIS — D49512 Neoplasm of unspecified behavior of left kidney: Secondary | ICD-10-CM | POA: Diagnosis not present

## 2022-11-07 DIAGNOSIS — C7801 Secondary malignant neoplasm of right lung: Secondary | ICD-10-CM | POA: Diagnosis not present

## 2022-11-07 DIAGNOSIS — C499 Malignant neoplasm of connective and soft tissue, unspecified: Secondary | ICD-10-CM | POA: Diagnosis not present

## 2022-11-07 DIAGNOSIS — C7802 Secondary malignant neoplasm of left lung: Secondary | ICD-10-CM | POA: Diagnosis not present

## 2022-11-07 NOTE — Anesthesia Postprocedure Evaluation (Signed)
Anesthesia Post Note  Patient: Tonya Palmer  Procedure(s) Performed: ROBOTIC ASSISTED NAVIGATIONAL BRONCHOSCOPY (Bilateral) BRONCHIAL NEEDLE ASPIRATION BIOPSIES BRONCHIAL BRUSHINGS BRONCHIAL BIOPSIES     Patient location during evaluation: Other Anesthesia Type: General Level of consciousness: awake and alert Pain management: pain level controlled Vital Signs Assessment: post-procedure vital signs reviewed and stable Respiratory status: spontaneous breathing, nonlabored ventilation and respiratory function stable Cardiovascular status: blood pressure returned to baseline and stable Postop Assessment: no apparent nausea or vomiting Anesthetic complications: no  No notable events documented.  Last Vitals:  Vitals:   11/06/22 1500 11/06/22 1515  BP: 130/81 130/70  Pulse: 75 77  Resp: 15 14  Temp:    SpO2: 93% 93%    Last Pain:  Vitals:   11/06/22 1422  TempSrc:   PainSc: 0-No pain                 Alixander Rallis,W. EDMOND

## 2022-11-11 ENCOUNTER — Encounter (HOSPITAL_COMMUNITY): Payer: Self-pay | Admitting: Pulmonary Disease

## 2022-11-12 ENCOUNTER — Encounter: Payer: Self-pay | Admitting: Physician Assistant

## 2022-11-12 ENCOUNTER — Other Ambulatory Visit: Payer: Self-pay | Admitting: Gynecologic Oncology

## 2022-11-12 LAB — CYTOLOGY - NON PAP

## 2022-11-12 NOTE — Progress Notes (Signed)
I called and spoke with patient regarding pathology results.  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  Thanks,  BLI  Garner Nash, DO [facility] 11/12/2022 5:01 PM

## 2022-11-13 ENCOUNTER — Other Ambulatory Visit: Payer: Self-pay | Admitting: Physician Assistant

## 2022-11-13 DIAGNOSIS — C499 Malignant neoplasm of connective and soft tissue, unspecified: Secondary | ICD-10-CM

## 2022-11-14 ENCOUNTER — Telehealth: Payer: Self-pay

## 2022-11-14 ENCOUNTER — Telehealth: Payer: Self-pay | Admitting: *Deleted

## 2022-11-14 ENCOUNTER — Encounter: Payer: Self-pay | Admitting: Gynecologic Oncology

## 2022-11-14 NOTE — Telephone Encounter (Signed)
Spoke with the patient regarding the referral to GYN oncology. Patient scheduled as new patient with Dr Berline Lopes on 12/15 at 10:30 am. Patient given an arrival time of 10 am.  Explained to the patient the the doctor will perform a pelvic exam at this visit. Patient given the policy that no visitors under the 16 yrs are allowed in the [place]. Patient given the address/phone number for the clinic and that the center offers free valet service. Patient aware of the new mask mandate.

## 2022-11-14 NOTE — Telephone Encounter (Signed)
Can you send a STAT referral to [facility] for this lady.  IT Please print CT scan, path report and our most recent notes    STAT referral and records faxed and confirmation received.

## 2022-11-16 ENCOUNTER — Encounter: Payer: Self-pay | Admitting: Gynecologic Oncology

## 2022-11-16 ENCOUNTER — Other Ambulatory Visit: Payer: Self-pay

## 2022-11-16 ENCOUNTER — Inpatient Hospital Stay: Payer: Medicare HMO | Attending: Physician Assistant | Admitting: Gynecologic Oncology

## 2022-11-16 VITALS — BP 145/66 | HR 82 | Temp 98.4°F | Resp 17 | Ht 65.0 in | Wt 174.6 lb

## 2022-11-16 DIAGNOSIS — D4819 Other specified neoplasm of uncertain behavior of connective and other soft tissue: Secondary | ICD-10-CM

## 2022-11-16 DIAGNOSIS — Z800 Family history of malignant neoplasm of digestive organs: Secondary | ICD-10-CM | POA: Diagnosis not present

## 2022-11-16 DIAGNOSIS — R630 Anorexia: Secondary | ICD-10-CM | POA: Insufficient documentation

## 2022-11-16 DIAGNOSIS — E785 Hyperlipidemia, unspecified: Secondary | ICD-10-CM | POA: Diagnosis not present

## 2022-11-16 DIAGNOSIS — R1900 Intra-abdominal and pelvic swelling, mass and lump, unspecified site: Secondary | ICD-10-CM

## 2022-11-16 DIAGNOSIS — Z1509 Genetic susceptibility to other malignant neoplasm: Secondary | ICD-10-CM | POA: Insufficient documentation

## 2022-11-16 DIAGNOSIS — R1903 Right lower quadrant abdominal swelling, mass and lump: Secondary | ICD-10-CM | POA: Diagnosis not present

## 2022-11-16 DIAGNOSIS — Z1502 Genetic susceptibility to malignant neoplasm of ovary: Secondary | ICD-10-CM | POA: Insufficient documentation

## 2022-11-16 DIAGNOSIS — Z803 Family history of malignant neoplasm of breast: Secondary | ICD-10-CM | POA: Diagnosis not present

## 2022-11-16 DIAGNOSIS — I10 Essential (primary) hypertension: Secondary | ICD-10-CM | POA: Insufficient documentation

## 2022-11-16 DIAGNOSIS — R918 Other nonspecific abnormal finding of lung field: Secondary | ICD-10-CM | POA: Diagnosis not present

## 2022-11-16 DIAGNOSIS — Z79899 Other long term (current) drug therapy: Secondary | ICD-10-CM | POA: Diagnosis not present

## 2022-11-16 DIAGNOSIS — Z90710 Acquired absence of both cervix and uterus: Secondary | ICD-10-CM | POA: Diagnosis not present

## 2022-11-16 DIAGNOSIS — R350 Frequency of micturition: Secondary | ICD-10-CM | POA: Diagnosis not present

## 2022-11-16 DIAGNOSIS — D4100 Neoplasm of uncertain behavior of unspecified kidney: Secondary | ICD-10-CM | POA: Insufficient documentation

## 2022-11-16 DIAGNOSIS — Z1501 Genetic susceptibility to malignant neoplasm of breast: Secondary | ICD-10-CM | POA: Insufficient documentation

## 2022-11-16 DIAGNOSIS — K6389 Other specified diseases of intestine: Secondary | ICD-10-CM

## 2022-11-16 DIAGNOSIS — N2889 Other specified disorders of kidney and ureter: Secondary | ICD-10-CM

## 2022-11-16 NOTE — Patient Instructions (Signed)
We will work with [facility] to start figuring out a time for your surgery.  I am also going to have your 2 biopsy sent to [facility] for pathology review.  Both biopsies would indicate that this may be a leiomyosarcoma, a type of cancer that arises from soft tissue.  While we frequently see this type of tumor in the uterus, it can arise in other areas such as your intestines and even your kidney.  Since your hysterectomy was so many years ago, I think it is more likely that this is a tumor that is arising from a different site.  Given the findings in your lungs, we will be talking about systemic therapy after surgery.  I think surgery is important to help better understand where the cancer arose and to assure that there is not a second type of cancer.

## 2022-11-16 NOTE — Progress Notes (Signed)
GYNECOLOGIC ONCOLOGY NEW PATIENT CONSULTATION   Patient Name: Tonya Palmer  Patient Age: 71 y.o. 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  I reviewed with the patient and her husband findings from scans and biopsies thus far.  We looked at her CT scan together, specifically the mesenteric mass and renal mass.  I also reviewed biopsy from the mesenteric mass and her lung nodule, both showing smooth muscle tumor with atypia concerning for possible leiomyosarcoma.  I have recommended pathology review at an outside institution.  We discussed the diagnosis of leiomyosarcoma.  While this can be a tumor that arises from the uterus, given the patient's hysterectomy approximately 30 years ago for benign disease, I think it is that this is GYN in origin.  I think more likely, this was a leiomyosarcoma arising from the GI track or kidney, although this is quite rare.  We also discussed the possibility that the patient may have 2 primary disease processes, the smooth muscle tumor and the kidney lesion.  I reviewed her imaging and although there appears to be a right ovary at the inferior aspect of the right lower quadrant mass, I do not believe that this mass is emanating from the ovary itself.  The patient has met with Dr. Lovena Neighbours who has recommended surgical excision of her renal mass.  I recommended referral to general surgery given her mesenteric mass.  I messaged both Dr. Lovena Neighbours and Dr. Zenia Resides (patient is scheduled to see her in January).  I am happy to coordinate being available at the time of a joint surgery.  A copy of this note was sent to the patient's referring provider.   65  minutes of total time was spent for this patient encounter, including preparation, face-to-face counseling with the patient and coordination of care, and documentation of the encounter.  Jeral Pinch, MD  Division of Gynecologic Oncology  Department of Obstetrics and Gynecology  [facility]  ___________________________________________  Chief Complaint: Chief Complaint  Patient presents with   Mesenteric mass    History of Present Illness:  Tonya Palmer is a 71 y.o. y.o. 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  She presented initially to her primary care provider in early November with right lower quadrant pain.  Upon discussion with her today, she denies having abdominal or pelvic pain previously.  She has experienced some abdominal bloating within the last month.  Work-up thus far:  10/18/2022: CT of the abdomen and pelvis shows an 8.3 cm solid exophytic mass of the left kidney favoring renal cell carcinoma versus less likely metastatic lesion.  There is a 12.4 cm multilobular heterogenously enhancing right lower quadrant mesenteric mass, not definitively arising from the bowel but abuts both bowel and the right ovary.  This favored to be metastatic lesion over a primary ovarian or bowel related tumor.  Scattered pulmonary nodules noted in the lung bases suspicious for metastatic disease.  Specific 1.3 cm hypodense lesion inferiorly in the right hepatic lobe. 10/26/2022: CT of the chest shows widespread metastatic disease, no mediastinal or hilar adenopathy. 10/29/2022: Biopsy of right lower quadrant abdominal mass consistent with smooth muscle tumor with atypia.  Increase cellularity and occasional mitotic figures raise the possibility of leiomyosarcoma. 11/06/2022: Right lung middle lobe biopsy reveals atypical smooth muscle proliferation.  Fragments of spindle cells with mild  to focally moderate nuclear atypia and rare mitotic figures noted.  Differential includes metastatic leiomyosarcoma.  Right lung middle lobe brushings reveal no malignant cells.  Her GYN history is notable for hysterectomy in her 73s for uterine fibroids.  This was performed through an abdominal incision here at [facility].  Ovaries were left in situ.  She reports normal bowel function.  Has had some decreased appetite secondary to anxiety regarding her diagnosis although this has recently improved some.  She denies any nausea or emesis.  With her decreased appetite, she thinks she is lost about 10 pounds.  She notes some urinary frequency, denies any incontinence or other urinary symptoms.  She denies any vaginal bleeding or discharge.  Family history is notable for breast cancer in her sister who has a BRCA2 mutation.  The patient herself has been tested previously and was negative for pathogenic mutation.  PAST MEDICAL HISTORY:  Past Medical History:  Diagnosis Date   Hyperlipidemia    Hypertension      PAST SURGICAL HISTORY:  Past Surgical History:  Procedure Laterality Date   ABDOMINAL HYSTERECTOMY     Left tubes and ovaries in place   BRONCHIAL BIOPSY  11/06/2022   Procedure: BRONCHIAL BIOPSIES;  Surgeon: Garner Nash, DO;  Location: [facility];  Service: Pulmonary;;   BRONCHIAL BRUSHINGS  11/06/2022   Procedure: BRONCHIAL BRUSHINGS;  Surgeon: Garner Nash, DO;  Location: [facility];  Service: Pulmonary;;   BRONCHIAL NEEDLE ASPIRATION BIOPSY  11/06/2022   Procedure: BRONCHIAL NEEDLE ASPIRATION BIOPSIES;  Surgeon: Garner Nash, DO;  Location: [facility];  Service: Pulmonary;;    OB/GYN HISTORY:  OB History  Gravida Para Term Preterm AB Living  3 3          SAB IAB Ectopic Multiple Live Births               # Outcome Date GA Lbr Len/2nd Weight Sex Delivery Anes PTL Lv  3 Para           2 Para           1 Para             No LMP recorded. Patient  has had a hysterectomy.  Age at menarche: 50 Age at menopause: In her 18s Hx of HRT: Not currently, was previously on oral hormone replacement Hx of STDs: Denies Last pap: Unsure History of abnormal pap smears: Denies  SCREENING STUDIES:  Last mammogram: 2023  Last colonoscopy: 2023  MEDICATIONS: Outpatient Encounter Medications as of 11/16/2022  Medication Sig   acetaminophen (TYLENOL) 500 MG tablet Take 500 mg by mouth every 6 (six) hours as needed for mild pain.   atorvastatin (LIPITOR) 10 MG tablet Take 10 mg by mouth daily.   valsartan-hydrochlorothiazide (DIOVAN-HCT) 160-12.5 MG tablet Take 1 tablet by mouth daily.   [DISCONTINUED] Multiple Vitamin (MULTIVITAMIN) capsule Take 1 capsule by mouth daily. Taking 3 times a week   No facility-administered encounter medications on file as of 11/16/2022.    ALLERGIES:  Allergies  Allergen Reactions   Sulfamethoxazole-Trimethoprim Rash     FAMILY HISTORY:  Family History  Problem Relation Age of Onset   Colon cancer Mother 6       treated with surgery only   Breast cancer Sister  BRCA positive   Stomach cancer Sister    Ovarian cancer Neg Hx    Endometrial cancer Neg Hx    Pancreatic cancer Neg Hx    Prostate cancer Neg Hx      SOCIAL HISTORY:  Social Connections: Not on file    REVIEW OF SYSTEMS:  Denies appetite changes, fevers, chills, fatigue, unexplained weight changes. Denies hearing loss, neck lumps or masses, mouth sores, ringing in ears or voice changes. Denies cough or wheezing.  Denies shortness of breath. Denies chest pain or palpitations. Denies leg swelling. Denies abdominal distention, pain, blood in stools, constipation, diarrhea, nausea, vomiting, or early satiety. Denies pain with intercourse, dysuria, frequency, hematuria or incontinence. Denies hot flashes, pelvic pain, vaginal bleeding or vaginal discharge.   Denies joint pain, back pain or muscle pain/cramps. Denies itching, rash, or  wounds. Denies dizziness, headaches, numbness or seizures. Denies swollen lymph nodes or glands, denies easy bruising or bleeding. Denies anxiety, depression, confusion, or decreased concentration.  Physical Exam:  Vital Signs for this encounter:  Blood pressure (!) 145/66, pulse 82, temperature 98.4 F (36.9 C), temperature source Oral, resp. rate 17, height _0  (1.651 m), weight 174 lb 9 oz (79.2 kg), SpO2 99 %. Body mass index is 29.05 kg/m. General: Alert, oriented, no acute distress.  HEENT: Normocephalic, atraumatic. Sclera anicteric.  Chest: Clear to auscultation bilaterally. No wheezes, rhonchi, or rales. Cardiovascular: Regular rate and rhythm, no murmurs, rubs, or gallops.  Abdomen: . Normoactive bowel sounds. Soft, nondistended, nontender to palpation.  Fullness appreciated in the right lower quadrant, at and above the level of the hip. No palpable fluid wave.  Extremities: Grossly normal range of motion. Warm, well perfused. No edema bilaterally.  Skin: No rashes or lesions.  Lymphatics: No cervical, supraclavicular, or inguinal adenopathy.  GU:  Normal external female genitalia. No lesions. No discharge or bleeding.             Bladder/urethra:  No lesions or masses, well supported bladder             Vagina: Moderately atrophic, no lesions.             Cervix/uterus: Surgically absent.             Adnexa: No adnexal masses appreciated.  LABORATORY AND RADIOLOGIC DATA:  Outside medical records were reviewed to synthesize the above history, along with the history and physical obtained during the visit.   Lab Results  Component Value Date   WBC 5.3 10/29/2022   HGB 13.9 10/29/2022   HCT 41.6 10/29/2022   PLT 268 10/29/2022   GLUCOSE 93 10/23/2022   ALT 17 10/23/2022   AST 25 10/23/2022   NA 139 10/23/2022   K 4.5 10/23/2022   CL 104 10/23/2022   CREATININE 1.02 (H) 10/23/2022   BUN 17 10/23/2022   CO2 28 10/23/2022   INR 1.0 10/29/2022

## 2022-11-19 ENCOUNTER — Other Ambulatory Visit: Payer: Self-pay | Admitting: Physician Assistant

## 2022-11-23 ENCOUNTER — Telehealth: Payer: Self-pay | Admitting: Oncology

## 2022-11-23 NOTE — Telephone Encounter (Signed)
[facility] Department of Pathology and they have both the biopsy and cytology that were sent for second opinion.

## 2022-11-28 ENCOUNTER — Telehealth: Payer: Self-pay | Admitting: Oncology

## 2022-11-28 NOTE — Telephone Encounter (Signed)
Tonya Palmer called and said she has a cold and is wondering who to contact - her PCP or Dr. Berline Lopes.  She is not running a temperature but has lots of mucus.  Advised her to call her PCP for recommendations.  She also asked if we know when her surgery will be scheduled.  Advised her that we should know more after her appointment with Dr. Zenia Resides on 12/04/22 because it will be a joint surgery.  Discussed that I will call her as soon as we hear from Dr. Ayesha Rumpf office.

## 2022-12-04 DIAGNOSIS — C499 Malignant neoplasm of connective and soft tissue, unspecified: Secondary | ICD-10-CM | POA: Diagnosis not present

## 2022-12-05 ENCOUNTER — Other Ambulatory Visit: Payer: Self-pay

## 2022-12-05 ENCOUNTER — Telehealth: Payer: Self-pay

## 2022-12-05 ENCOUNTER — Other Ambulatory Visit: Payer: Self-pay | Admitting: Physician Assistant

## 2022-12-05 DIAGNOSIS — C7989 Secondary malignant neoplasm of other specified sites: Secondary | ICD-10-CM

## 2022-12-05 DIAGNOSIS — C499 Malignant neoplasm of connective and soft tissue, unspecified: Secondary | ICD-10-CM

## 2022-12-05 DIAGNOSIS — R918 Other nonspecific abnormal finding of lung field: Secondary | ICD-10-CM

## 2022-12-05 DIAGNOSIS — N2889 Other specified disorders of kidney and ureter: Secondary | ICD-10-CM

## 2022-12-05 NOTE — Telephone Encounter (Signed)
can you call [facility] to see if they received a referral for this patient for sarcoma specialist   They have not received the referral yet  HB   IT Lincoln Brigham, PA-C can you fax over the referral for [place] please IT if they didn't receive a referral, then we can put one in   [place], PA-C Make it STAT please   STAT referral faxed to [facility]  [phone].  Confirmation received

## 2022-12-05 NOTE — Progress Notes (Signed)
I followed up with Tonya Palmer to discuss next steps after consultation with Dr. Zenia Resides yesterday. Since surgery is not recommended, Dr. Lorenso Courier recommends a follow up to discuss systemic therapy options. She will need a port which she was agreeable to schedule. She is waiting for a second opinion with sarcoma specialist at [facility]. I will make a tentative follow up in 1-2 weeks with Dr. Lorenso Courier to finalize treatment recommendations. She appreciated my call and was in agreement with the plan.

## 2022-12-06 ENCOUNTER — Telehealth: Payer: Self-pay | Admitting: Hematology and Oncology

## 2022-12-06 ENCOUNTER — Ambulatory Visit (HOSPITAL_COMMUNITY): Payer: Medicare HMO

## 2022-12-06 NOTE — Telephone Encounter (Signed)
Called patient to notify of upcoming appointment. Patient notified of 1/3 by RN.

## 2022-12-07 DIAGNOSIS — C499 Malignant neoplasm of connective and soft tissue, unspecified: Secondary | ICD-10-CM | POA: Diagnosis not present

## 2022-12-11 ENCOUNTER — Inpatient Hospital Stay: Payer: Medicare HMO

## 2022-12-11 ENCOUNTER — Other Ambulatory Visit: Payer: Self-pay

## 2022-12-11 ENCOUNTER — Inpatient Hospital Stay: Payer: Medicare HMO | Attending: Physician Assistant | Admitting: Hematology and Oncology

## 2022-12-11 ENCOUNTER — Other Ambulatory Visit: Payer: Self-pay | Admitting: Hematology and Oncology

## 2022-12-11 ENCOUNTER — Other Ambulatory Visit: Payer: Self-pay | Admitting: Radiology

## 2022-12-11 VITALS — BP 134/85 | HR 81 | Temp 98.1°F | Resp 16 | Wt 174.3 lb

## 2022-12-11 DIAGNOSIS — C7801 Secondary malignant neoplasm of right lung: Secondary | ICD-10-CM | POA: Insufficient documentation

## 2022-12-11 DIAGNOSIS — Z800 Family history of malignant neoplasm of digestive organs: Secondary | ICD-10-CM | POA: Insufficient documentation

## 2022-12-11 DIAGNOSIS — Z803 Family history of malignant neoplasm of breast: Secondary | ICD-10-CM | POA: Insufficient documentation

## 2022-12-11 DIAGNOSIS — I10 Essential (primary) hypertension: Secondary | ICD-10-CM | POA: Diagnosis not present

## 2022-12-11 DIAGNOSIS — R918 Other nonspecific abnormal finding of lung field: Secondary | ICD-10-CM

## 2022-12-11 DIAGNOSIS — Z90710 Acquired absence of both cervix and uterus: Secondary | ICD-10-CM | POA: Diagnosis not present

## 2022-12-11 DIAGNOSIS — C499 Malignant neoplasm of connective and soft tissue, unspecified: Secondary | ICD-10-CM | POA: Insufficient documentation

## 2022-12-11 DIAGNOSIS — C7802 Secondary malignant neoplasm of left lung: Secondary | ICD-10-CM | POA: Diagnosis not present

## 2022-12-11 LAB — CMP (CANCER CENTER ONLY)
ALT: 11 U/L (ref 0–44)
AST: 18 U/L (ref 15–41)
Albumin: 3.8 g/dL (ref 3.5–5.0)
Alkaline Phosphatase: 46 U/L (ref 38–126)
Anion gap: 7 (ref 5–15)
BUN: 21 mg/dL (ref 8–23)
CO2: 29 mmol/L (ref 22–32)
Calcium: 9.8 mg/dL (ref 8.9–10.3)
Chloride: 103 mmol/L (ref 98–111)
Creatinine: 1.06 mg/dL — ABNORMAL HIGH (ref 0.44–1.00)
GFR, Estimated: 55 mL/min — ABNORMAL LOW (ref >60)
Glucose, Bld: 111 mg/dL — ABNORMAL HIGH (ref 70–99)
Potassium: 4.3 mmol/L (ref 3.5–5.1)
Sodium: 139 mmol/L (ref 135–145)
Total Bilirubin: 0.4 mg/dL (ref 0.3–1.2)
Total Protein: 7.2 g/dL (ref 6.5–8.1)

## 2022-12-11 LAB — CBC WITH DIFFERENTIAL (CANCER CENTER ONLY)
Abs Immature Granulocytes: 0.02 10*3/uL (ref 0.00–0.07)
Basophils Absolute: 0 10*3/uL (ref 0.0–0.1)
Basophils Relative: 1 %
Eosinophils Absolute: 0.1 10*3/uL (ref 0.0–0.5)
Eosinophils Relative: 2 %
HCT: 36.9 % (ref 36.0–46.0)
Hemoglobin: 12.7 g/dL (ref 12.0–15.0)
Immature Granulocytes: 0 %
Lymphocytes Relative: 32 %
Lymphs Abs: 2 10*3/uL (ref 0.7–4.0)
MCH: 30.8 pg (ref 26.0–34.0)
MCHC: 34.4 g/dL (ref 30.0–36.0)
MCV: 89.3 fL (ref 80.0–100.0)
Monocytes Absolute: 0.6 10*3/uL (ref 0.1–1.0)
Monocytes Relative: 9 %
Neutro Abs: 3.5 10*3/uL (ref 1.7–7.7)
Neutrophils Relative %: 56 %
Platelet Count: 344 10*3/uL (ref 150–400)
RBC: 4.13 MIL/uL (ref 3.87–5.11)
RDW: 12.9 % (ref 11.5–15.5)
WBC Count: 6.2 10*3/uL (ref 4.0–10.5)
nRBC: 0 % (ref 0.0–0.2)

## 2022-12-11 NOTE — Progress Notes (Signed)
[place] [phone]   [phone]  PROGRESS NOTE  Patient Care Team: Charlane Ferretti, MD as PCP - General (Internal Medicine)  Hematological/Oncological History # Metastatic Leiomyosarcoma 10/18/2022: CT abdomen showed 8.3 cm solid exophytic mass of the left kidney upper pole favoring renal cell carcinoma versus less likely metastatic lesion. Also noted 12.4 cm multilobular heterogeneously enhancing right lower quadrant mesenteric mass 10/23/2022: establish care with Dr. Lorenso Courier and Dede Query 10/26/2022: CT chest showed widespread metastatic disease to the lungs, as above  10/29/2022: biopsy confirms likely leiomyosarcoma    Interval History:  Tonya Palmer 72 y.o. female with medical history significant for metastatic leiomyosarcoma who presents for a follow up visit. The patient's last visit was on 10/23/2022 at which time she established care. In the interim since the last visit she has undergone extensive testing which confirms metastatic leiomyosarcoma.  On exam today Tonya Palmer is accompanied by her husband.  She reports that she has been well overall interim since her last visit and has a hard time believing anything is wrong.  She feels great and has no shortness of breath, chest pain, abdominal pain, or other symptoms.  She notes her energy levels are good and her appetite is strong.  She finds it hard to comprehend how she has cancer.  She recently had a visit with surgery at [facility] where they noted surgical intervention was not required.  She was scheduled to see Dr. Kendall Flack on Friday and will be following through with this appointment.  She otherwise denies any fevers, chills, sweats, nausea, vomiting or diarrhea.  The bulk of our discussion focused on the diagnosis of leiomyosarcoma in the options moving forward.  We discussed chemotherapy treatments and where her care could be managed, [place].   We also discussed the concept of comanaged care.  She voiced understanding of the plan moving forward and will visit with Dr. Kendall Flack to see his recommendations.  MEDICAL HISTORY:  Past Medical History:  Diagnosis Date   Hyperlipidemia    Hypertension     SURGICAL HISTORY: Past Surgical History:  Procedure Laterality Date   ABDOMINAL HYSTERECTOMY     Left tubes and ovaries in place   BRONCHIAL BIOPSY  11/06/2022   Procedure: BRONCHIAL BIOPSIES;  Surgeon: Garner Nash, DO;  Location: [facility];  Service: Pulmonary;;   BRONCHIAL BRUSHINGS  11/06/2022   Procedure: BRONCHIAL BRUSHINGS;  Surgeon: Garner Nash, DO;  Location: [place];  Service: Pulmonary;;   BRONCHIAL NEEDLE ASPIRATION BIOPSY  11/06/2022   Procedure: BRONCHIAL NEEDLE ASPIRATION BIOPSIES;  Surgeon: Garner Nash, DO;  Location: [facility];  Service: Pulmonary;;    SOCIAL HISTORY: Social History   Socioeconomic History   Marital status: Married    Spouse name: Not on file   Number of children: Not on file   Years of education: Not on file   Highest education level: Not on file  Occupational History   Not on file  Tobacco Use   Smoking status: Never   Smokeless tobacco: Never  Vaping Use   Vaping Use: Never used  Substance and Sexual Activity   Alcohol use: Yes    Comment: once a month   Drug use: Never   Sexual activity: Not Currently  Other Topics Concern   Not on file  Social History Narrative   Not on file   Social Determinants of Health   Financial Resource Strain: Not on file  Food  Insecurity: Not on file  Transportation Needs: Not on file  Physical Activity: Not on file  Stress: Not on file  Social Connections: Not on file  Intimate Partner Violence: Not on file    FAMILY HISTORY: Family History  Problem Relation Age of Onset   Colon cancer Mother 35       treated with surgery only   Breast cancer Sister        BRCA positive   Stomach cancer Sister    Ovarian cancer  Neg Hx    Endometrial cancer Neg Hx    Pancreatic cancer Neg Hx    Prostate cancer Neg Hx     ALLERGIES:  is allergic to sulfamethoxazole-trimethoprim.  MEDICATIONS:  Current Outpatient Medications  Medication Sig Dispense Refill   acetaminophen (TYLENOL) 500 MG tablet Take 500 mg by mouth every 6 (six) hours as needed for mild pain.     atorvastatin (LIPITOR) 10 MG tablet Take 10 mg by mouth daily.     valsartan-hydrochlorothiazide (DIOVAN-HCT) 160-12.5 MG tablet Take 1 tablet by mouth daily.     No current facility-administered medications for this visit.    REVIEW OF SYSTEMS:   Constitutional: ( - ) fevers, ( - )  chills , ( - ) night sweats Eyes: ( - ) blurriness of vision, ( - ) double vision, ( - ) watery eyes Ears, nose, mouth, throat, and face: ( - ) mucositis, ( - ) sore throat Respiratory: ( - ) cough, ( - ) dyspnea, ( - ) wheezes Cardiovascular: ( - ) palpitation, ( - ) chest discomfort, ( - ) lower extremity swelling Gastrointestinal:  ( - ) nausea, ( - ) heartburn, ( - ) change in bowel habits Skin: ( - ) abnormal skin rashes Lymphatics: ( - ) new lymphadenopathy, ( - ) easy bruising Neurological: ( - ) numbness, ( - ) tingling, ( - ) new weaknesses Behavioral/Psych: ( - ) mood change, ( - ) new changes  All other systems were reviewed with the patient and are negative.  PHYSICAL EXAMINATION: ECOG PERFORMANCE STATUS: 0 - Asymptomatic  Vitals:   12/11/22 1148  BP: 134/85  Pulse: 81  Resp: 16  Temp: 98.1 F (36.7 C)  SpO2: 98%   Filed Weights   12/11/22 1148  Weight: 174 lb 4.8 oz (79.1 kg)    GENERAL: Well-appearing elderly Caucasian female, alert, no distress and comfortable SKIN: skin color, texture, turgor are normal, no rashes or significant lesions EYES: conjunctiva are pink and non-injected, sclera clear LUNGS: clear to auscultation and percussion with normal breathing effort HEART: regular rate & rhythm and no murmurs and no lower extremity  edema Musculoskeletal: no cyanosis of digits and no clubbing  PSYCH: alert & oriented x 3, fluent speech NEURO: no focal motor/sensory deficits  LABORATORY DATA:  I have reviewed the data as listed    Latest Ref Rng & Units 12/11/2022   11:05 AM 10/29/2022   10:15 AM 10/23/2022    2:01 PM  CBC  WBC 4.0 - 10.5 K/uL 6.2  5.3  7.2   Hemoglobin 12.0 - 15.0 g/dL 12.7  13.9  13.9   Hematocrit 36.0 - 46.0 % 36.9  41.6  41.2   Platelets 150 - 400 K/uL 344  268  279        Latest Ref Rng & Units 12/11/2022   11:05 AM 10/23/2022    2:01 PM 06/25/2007    4:40 AM  CMP  Glucose 70 - 99 mg/dL  111  93  94   BUN 8 - 23 mg/dL 21  17  17    Creatinine 0.44 - 1.00 mg/dL 1.06  1.02  0.84   Sodium 135 - 145 mmol/L 139  139  139   Potassium 3.5 - 5.1 mmol/L 4.3  4.5  3.9   Chloride 98 - 111 mmol/L 103  104  105   CO2 22 - 32 mmol/L 29  28  26    Calcium 8.9 - 10.3 mg/dL 9.8  10.1  9.1   Total Protein 6.5 - 8.1 g/dL 7.2  7.4    Total Bilirubin 0.3 - 1.2 mg/dL 0.4  0.7    Alkaline Phos 38 - 126 U/L 46  44    AST 15 - 41 U/L 18  25    ALT 0 - 44 U/L 11  17      RADIOGRAPHIC STUDIES: No results found.  ASSESSMENT & PLAN JONI COLEGROVE 72 y.o. female with medical history significant for metastatic leiomyosarcoma who presents for a follow up visit.  # Metastatic Leiomyosarcoma -- Scans are complete, confirm metastatic disease and leiomyosarcoma.  Biopsy of both abdominal lesion and lung lesion confirm this. -- Port is scheduled for tomorrow and echocardiogram will be scheduled. -- Our recommended regimen at this time would be doxorubicin and trabectedin q 3 weeks (Lancet Oncol. 2022 Aug;23(8):1044-1054).  -- Patient will discuss care options with Dr. Kendall Flack as well.  If he has alternative recommendations we be happy to administer them here. -- Discussed with patient comanaged care versus care predominantly here or at [facility].  She will consider this after discussion with Dr. Kendall Flack. -- Return  to clinic ending results of her visit with [facility].  Orders Placed This Encounter  Procedures   ECHOCARDIOGRAM COMPLETE    Standing Status:   Future    Standing Expiration Date:   12/12/2023    Order Specific Question:   Where should this test be performed    Answer:   [place]    Order Specific Question:   Perflutren DEFINITY (image enhancing agent) should be administered unless hypersensitivity or allergy exist    Answer:   Administer Perflutren    Order Specific Question:   Is a special reader required? (athlete or structural heart)    Answer:   No    Order Specific Question:   Reason for exam-Echo    Answer:   Chemo  Z09    Order Specific Question:   Other Comments    Answer:   Echocardiogram required prior to administration of chemotherapy.    All questions were answered. The patient knows to call the clinic with any problems, questions or concerns.  A total of more than 30 minutes were spent on this encounter with face-to-face time and non-face-to-face time, including preparing to see the patient, ordering tests and/or medications, counseling the patient and coordination of care as outlined above.   Ledell Peoples, MD Department of Hematology/Oncology [place] [facility] Phone: [phone] Pager: [phone] Email: [email or web address]  12/11/2022 3:38 PM

## 2022-12-11 NOTE — H&P (Signed)
    Referring Physician(s): Dorsey,J  Supervising Physician: Michaelle Birks  Patient Status:  [facility] OP  Chief Complaint:  "I'm getting a port a cath"  Subjective: Patient familiar to IR service for right lower quadrant abdominal mass biopsy on 10/29/2022.  She has a history of HTN, HLD and metastatic leiomyosarcoma diagnosed in November of last year.  She presents today for Port-A-Cath placement to assist with treatment.     Past Medical History:  Diagnosis Date   Hyperlipidemia    Hypertension    Past Surgical History:  Procedure Laterality Date   ABDOMINAL HYSTERECTOMY     Left tubes and ovaries in place   BRONCHIAL BIOPSY  11/06/2022   Procedure: BRONCHIAL BIOPSIES;  Surgeon: Garner Nash, DO;  Location: [facility];  Service: Pulmonary;;   BRONCHIAL BRUSHINGS  11/06/2022   Procedure: BRONCHIAL BRUSHINGS;  Surgeon: Garner Nash, DO;  Location: [place];  Service: Pulmonary;;   BRONCHIAL NEEDLE ASPIRATION BIOPSY  11/06/2022   Procedure: BRONCHIAL NEEDLE ASPIRATION BIOPSIES;  Surgeon: Garner Nash, DO;  Location: [place];  Service: Pulmonary;;      Allergies: Sulfamethoxazole-trimethoprim  Medications: Prior to Admission medications   Medication Sig Start Date End Date Taking? Authorizing Provider  acetaminophen (TYLENOL) 500 MG tablet Take 500 mg by mouth every 6 (six) hours as needed for mild pain.    [provider]  atorvastatin (LIPITOR) 10 MG tablet Take 10 mg by mouth daily.    [provider]  valsartan-hydrochlorothiazide (DIOVAN-HCT) 160-12.5 MG tablet Take 1 tablet by mouth daily. 10/07/22   [provider]     Vital Signs:   Physical Exam  Imaging: No results found.  Labs:  CBC: Recent Labs    10/23/22 1401 10/29/22 1015 12/11/22 1105  WBC 7.2 5.3 6.2  HGB 13.9 13.9 12.7  HCT 41.2 41.6 36.9  PLT 279 268 344    COAGS: Recent Labs    10/29/22 1015  INR 1.0    BMP: Recent Labs     10/23/22 1401 12/11/22 1105  NA 139 139  K 4.5 4.3  CL 104 103  CO2 28 29  GLUCOSE 93 111*  BUN 17 21  CALCIUM 10.1 9.8  CREATININE 1.02* 1.06*  GFRNONAA 58* 55*    LIVER FUNCTION TESTS: Recent Labs    10/23/22 1401 12/11/22 1105  BILITOT 0.7 0.4  AST 25 18  ALT 17 11  ALKPHOS 44 46  PROT 7.4 7.2  ALBUMIN 4.6 3.8    Assessment and Plan: Patient familiar to IR service for right lower quadrant abdominal mass biopsy on 10/29/2022.  She has a history of HTN,HLD and metastatic leiomyosarcoma diagnosed in November of last year.  She presents today for Port-A-Cath placement to assist with treatment.Risks and benefits of image guided port-a-catheter placement was discussed with the patient including, but not limited to bleeding, infection, pneumothorax, or fibrin sheath development and need for additional procedures.  All of the patient's questions were answered, patient is agreeable to proceed. Consent signed and in chart.    Electronically Signed: D. Rowe Robert, PA-C 12/11/2022, 4:37 PM   I spent a total of 25 minutes at the the patient's bedside AND on the patient's hospital floor or unit, greater than 50% of which was counseling/coordinating care for port a cath placement

## 2022-12-12 ENCOUNTER — Encounter (HOSPITAL_COMMUNITY): Payer: Self-pay

## 2022-12-12 ENCOUNTER — Ambulatory Visit (HOSPITAL_COMMUNITY)
Admission: RE | Admit: 2022-12-12 | Discharge: 2022-12-12 | Disposition: A | Discharge status: Home / Self Care | Payer: Medicare HMO | Source: Ambulatory Visit | Attending: Physician Assistant | Admitting: Physician Assistant

## 2022-12-12 ENCOUNTER — Ambulatory Visit (HOSPITAL_COMMUNITY)
Admission: RE | Admit: 2022-12-12 | Discharge: 2022-12-12 | Disposition: A | Discharge status: Home / Self Care | Payer: Medicare HMO | Source: Ambulatory Visit

## 2022-12-12 ENCOUNTER — Other Ambulatory Visit: Payer: Self-pay

## 2022-12-12 DIAGNOSIS — Z452 Encounter for adjustment and management of vascular access device: Secondary | ICD-10-CM | POA: Diagnosis not present

## 2022-12-12 DIAGNOSIS — E785 Hyperlipidemia, unspecified: Secondary | ICD-10-CM | POA: Diagnosis not present

## 2022-12-12 DIAGNOSIS — I10 Essential (primary) hypertension: Secondary | ICD-10-CM | POA: Diagnosis not present

## 2022-12-12 DIAGNOSIS — C499 Malignant neoplasm of connective and soft tissue, unspecified: Secondary | ICD-10-CM

## 2022-12-12 HISTORY — PX: IR IMAGING GUIDED PORT INSERTION: IMG5740

## 2022-12-12 MED ORDER — FENTANYL CITRATE (PF) 100 MCG/2ML IJ SOLN
INTRAMUSCULAR | Status: AC
  Filled 2022-12-12: qty 2

## 2022-12-12 MED ORDER — FENTANYL CITRATE (PF) 100 MCG/2ML IJ SOLN
INTRAMUSCULAR | Status: AC | PRN
  Administered 2022-12-12: 75 ug via INTRAVENOUS
  Administered 2022-12-12 (×2): 50 ug via INTRAVENOUS
  Administered 2022-12-12: 25 ug via INTRAVENOUS

## 2022-12-12 MED ORDER — SODIUM CHLORIDE 0.9 % IV SOLN: INTRAVENOUS | Status: DC

## 2022-12-12 MED ORDER — LIDOCAINE-EPINEPHRINE 1 %-1:100000 IJ SOLN
INTRAMUSCULAR | Status: AC
  Administered 2022-12-12: 20 mL
  Filled 2022-12-12: qty 1

## 2022-12-12 MED ORDER — HEPARIN SOD (PORK) LOCK FLUSH 100 UNIT/ML IV SOLN
INTRAVENOUS | Status: AC
  Administered 2022-12-12: 500 [IU]
  Filled 2022-12-12: qty 5

## 2022-12-12 MED ORDER — MIDAZOLAM HCL 2 MG/2ML IJ SOLN
INTRAMUSCULAR | Status: AC
  Filled 2022-12-12: qty 2

## 2022-12-12 MED ORDER — MIDAZOLAM HCL 2 MG/2ML IJ SOLN
INTRAMUSCULAR | Status: AC | PRN
  Administered 2022-12-12: .5 mg via INTRAVENOUS
  Administered 2022-12-12: 1.5 mg via INTRAVENOUS
  Administered 2022-12-12 (×3): 1 mg via INTRAVENOUS

## 2022-12-12 NOTE — Discharge Instructions (Addendum)
Please call Interventional Radiology clinic [phone] with any questions or concerns.  You may remove your dressing and shower tomorrow.  DO NOT use EMLA cream on your port site for 2 weeks as this cream will remove surgical glue on your incision.  Implanted Port Insertion, Care After The following information offers guidance on how to care for yourself after your procedure. Your health care provider may also give you more specific instructions. If you have problems or questions, contact your health care provider. What can I expect after the procedure? After the procedure, it is common to have: Discomfort at the port insertion site. Bruising on the skin over the port. This should improve over 3-4 days. Follow these instructions at home: [facility] care After your port is placed, you will get a manufacturer's information card. The card has information about your port. Keep this card with you at all times. Take care of the port as told by your health care provider. Ask your health care provider if you or a family member can get training for taking care of the port at home. A home health care nurse will be be available to help care for the port. Make sure to remember what type of port you have. Incision care     Follow instructions from your health care provider about how to take care of your port insertion site. Make sure you: Wash your hands with soap and water for at least 20 seconds before and after you change your bandage (dressing). If soap and water are not available, use hand sanitizer. Change your dressing as told by your health care provider. Leave stitches (sutures), skin glue, or adhesive strips in place. These skin closures may need to stay in place for 2 weeks or longer. If adhesive strip edges start to loosen and curl up, you may trim the loose edges. Do not remove adhesive strips completely unless your health care provider tells you to do that. Check your port insertion site every  day for signs of infection. Check for: Redness, swelling, or pain. Fluid or blood. Warmth. Pus or a bad smell. Activity Return to your normal activities as told by your health care provider. Ask your health care provider what activities are safe for you. You may have to avoid lifting. Ask your health care provider how much you can safely lift. General instructions Take over-the-counter and prescription medicines only as told by your health care provider. Do not take baths, swim, or use a hot tub until your health care provider approves. Ask your health care provider if you may take showers. You may only be allowed to take sponge baths. If you were given a sedative during the procedure, it can affect you for several hours. Do not drive or operate machinery until your health care provider says that it is safe. Wear a medical alert bracelet in case of an emergency. This will tell any health care providers that you have a port. Keep all follow-up visits. This is important. Contact a health care provider if: You cannot flush your port with saline as directed, or you cannot draw blood from the port. You have a fever or chills. You have redness, swelling, or pain around your port insertion site. You have fluid or blood coming from your port insertion site. Your port insertion site feels warm to the touch. You have pus or a bad smell coming from the port insertion site. Get help right away if: You have chest pain or shortness of breath. You  have bleeding from your port that you cannot control. These symptoms may be an emergency. Get help right away. Call 911. Do not wait to see if the symptoms will go away. Do not drive yourself to the hospital. Summary Take care of the port as told by your health care provider. Keep the manufacturer's information card with you at all times. Change your dressing as told by your health care provider. Contact a health care provider if you have a fever or chills  or if you have redness, swelling, or pain around your port insertion site. Keep all follow-up visits. This information is not intended to replace advice given to you by your health care provider. Make sure you discuss any questions you have with your health care provider. Document Revised: 05/23/2021 Document Reviewed: 05/23/2021 Elsevier Patient Education  [place].         Moderate Conscious Sedation, Adult, Care After This sheet gives you information about how to care for yourself after your procedure. Your health care provider may also give you more specific instructions. If you have problems or questions, contact your health care provider. What can I expect after the procedure? After the procedure, it is common to have: Sleepiness for several hours. Impaired judgment for several hours. Difficulty with balance. Vomiting if you eat too soon. Follow these instructions at home: For the time period you were told by your health care provider: Rest. Do not participate in activities where you could fall or become injured. Do not drive or use machinery. Do not drink alcohol. Do not take sleeping pills or medicines that cause drowsiness. Do not make important decisions or sign legal documents. Do not take care of children on your own.        Eating and drinking Follow the diet recommended by your health care provider. Drink enough fluid to keep your urine pale yellow. If you vomit: Drink water, juice, or soup when you can drink without vomiting. Make sure you have little or no nausea before eating solid foods.    General instructions Take over-the-counter and prescription medicines only as told by your health care provider. Have a responsible adult stay with you for the time you are told. It is important to have someone help care for you until you are awake and alert. Do not smoke. Keep all follow-up visits as told by your health care provider. This is  important. Contact a health care provider if: You are still sleepy or having trouble with balance after 24 hours. You feel light-headed. You keep feeling nauseous or you keep vomiting. You develop a rash. You have a fever. You have redness or swelling around the IV site. Get help right away if: You have trouble breathing. You have new-onset confusion at home. Summary After the procedure, it is common to feel sleepy, have impaired judgment, or feel nauseous if you eat too soon. Rest after you get home. Know the things you should not do after the procedure. Follow the diet recommended by your health care provider and drink enough fluid to keep your urine pale yellow. Get help right away if you have trouble breathing or new-onset confusion at home. This information is not intended to replace advice given to you by your health care provider. Make sure you discuss any questions you have with your health care provider. Document Revised: 03/18/2020 Document Reviewed: 10/15/2019 Elsevier Patient Education  2021 [employer].

## 2022-12-12 NOTE — Procedures (Signed)
Vascular and Interventional Radiology Procedure Note  Patient: Tonya Palmer DOB: 1950-12-06 Medical Record Number: [id number] Note Date/Time: 12/12/22 1:02 PM   Performing Physician: Michaelle Birks, MD Assistant(s): None  Diagnosis:  Metastatic leiomyosarcoma.  Procedure: PORT PLACEMENT  Anesthesia: Conscious Sedation Complications: None Estimated Blood Loss: Minimal  Findings:  Successful right-sided port placement, with the tip of the catheter in the proximal right atrium.  Plan: Catheter ready for use.  See detailed procedure note with images in PACS. The patient tolerated the procedure well without incident or complication and was returned to Recovery in stable condition.    Michaelle Birks, MD [facility] Interventional [facility]iology [facility] Radiology   Pager. [place]

## 2022-12-14 DIAGNOSIS — C3491 Malignant neoplasm of unspecified part of right bronchus or lung: Secondary | ICD-10-CM | POA: Diagnosis not present

## 2022-12-14 DIAGNOSIS — C7801 Secondary malignant neoplasm of right lung: Secondary | ICD-10-CM | POA: Diagnosis not present

## 2022-12-14 DIAGNOSIS — N2889 Other specified disorders of kidney and ureter: Secondary | ICD-10-CM | POA: Diagnosis not present

## 2022-12-14 DIAGNOSIS — Z95828 Presence of other vascular implants and grafts: Secondary | ICD-10-CM | POA: Diagnosis not present

## 2022-12-14 DIAGNOSIS — R1900 Intra-abdominal and pelvic swelling, mass and lump, unspecified site: Secondary | ICD-10-CM | POA: Diagnosis not present

## 2022-12-14 DIAGNOSIS — C499 Malignant neoplasm of connective and soft tissue, unspecified: Secondary | ICD-10-CM | POA: Diagnosis not present

## 2022-12-14 DIAGNOSIS — C7802 Secondary malignant neoplasm of left lung: Secondary | ICD-10-CM | POA: Diagnosis not present

## 2022-12-14 DIAGNOSIS — C3492 Malignant neoplasm of unspecified part of left bronchus or lung: Secondary | ICD-10-CM | POA: Diagnosis not present

## 2022-12-14 DIAGNOSIS — Z959 Presence of cardiac and vascular implant and graft, unspecified: Secondary | ICD-10-CM | POA: Diagnosis not present

## 2022-12-17 ENCOUNTER — Telehealth: Payer: Self-pay

## 2022-12-17 NOTE — Telephone Encounter (Signed)
Patient has decided to receive treatment at [facility].

## 2022-12-21 ENCOUNTER — Ambulatory Visit (HOSPITAL_COMMUNITY)
Admission: RE | Admit: 2022-12-21 | Discharge: 2022-12-21 | Disposition: A | Discharge status: Home / Self Care | Payer: Medicare HMO | Source: Ambulatory Visit | Attending: Hematology and Oncology | Admitting: Hematology and Oncology

## 2022-12-21 DIAGNOSIS — C499 Malignant neoplasm of connective and soft tissue, unspecified: Secondary | ICD-10-CM

## 2022-12-21 DIAGNOSIS — E785 Hyperlipidemia, unspecified: Secondary | ICD-10-CM | POA: Diagnosis not present

## 2022-12-21 DIAGNOSIS — Z0189 Encounter for other specified special examinations: Secondary | ICD-10-CM

## 2022-12-21 DIAGNOSIS — I10 Essential (primary) hypertension: Secondary | ICD-10-CM | POA: Diagnosis not present

## 2022-12-21 LAB — ECHOCARDIOGRAM COMPLETE
Area-P 1/2: 3.91 cm2
Calc EF: 57.7 %
S' Lateral: 3 cm
Single Plane A2C EF: 56.1 %
Single Plane A4C EF: 61.3 %

## 2022-12-21 NOTE — Progress Notes (Signed)
Echocardiogram 2D Echocardiogram has been performed.  Tonya Palmer 12/21/2022, 9:58 AM

## 2022-12-24 ENCOUNTER — Other Ambulatory Visit (HOSPITAL_COMMUNITY): Payer: Medicare HMO

## 2022-12-28 DIAGNOSIS — D6481 Anemia due to antineoplastic chemotherapy: Secondary | ICD-10-CM | POA: Diagnosis not present

## 2022-12-28 DIAGNOSIS — Z95828 Presence of other vascular implants and grafts: Secondary | ICD-10-CM | POA: Diagnosis not present

## 2022-12-28 DIAGNOSIS — C7801 Secondary malignant neoplasm of right lung: Secondary | ICD-10-CM | POA: Diagnosis not present

## 2022-12-28 DIAGNOSIS — C499 Malignant neoplasm of connective and soft tissue, unspecified: Secondary | ICD-10-CM | POA: Diagnosis not present

## 2022-12-28 DIAGNOSIS — C7800 Secondary malignant neoplasm of unspecified lung: Secondary | ICD-10-CM | POA: Diagnosis not present

## 2022-12-28 DIAGNOSIS — I10 Essential (primary) hypertension: Secondary | ICD-10-CM | POA: Diagnosis not present

## 2022-12-28 DIAGNOSIS — C762 Malignant neoplasm of abdomen: Secondary | ICD-10-CM | POA: Diagnosis not present

## 2022-12-28 DIAGNOSIS — R110 Nausea: Secondary | ICD-10-CM | POA: Diagnosis not present

## 2022-12-28 DIAGNOSIS — Z5111 Encounter for antineoplastic chemotherapy: Secondary | ICD-10-CM | POA: Diagnosis not present

## 2022-12-28 DIAGNOSIS — T451X5A Adverse effect of antineoplastic and immunosuppressive drugs, initial encounter: Secondary | ICD-10-CM | POA: Diagnosis not present

## 2022-12-28 DIAGNOSIS — E785 Hyperlipidemia, unspecified: Secondary | ICD-10-CM | POA: Diagnosis not present

## 2022-12-28 DIAGNOSIS — I2510 Atherosclerotic heart disease of native coronary artery without angina pectoris: Secondary | ICD-10-CM | POA: Diagnosis not present

## 2022-12-28 DIAGNOSIS — C7802 Secondary malignant neoplasm of left lung: Secondary | ICD-10-CM | POA: Diagnosis not present

## 2022-12-28 DIAGNOSIS — N289 Disorder of kidney and ureter, unspecified: Secondary | ICD-10-CM | POA: Diagnosis not present

## 2022-12-28 DIAGNOSIS — N2889 Other specified disorders of kidney and ureter: Secondary | ICD-10-CM | POA: Diagnosis not present

## 2022-12-31 ENCOUNTER — Telehealth: Payer: Self-pay | Admitting: Hematology and Oncology

## 2022-12-31 ENCOUNTER — Other Ambulatory Visit: Payer: Self-pay

## 2022-12-31 DIAGNOSIS — C499 Malignant neoplasm of connective and soft tissue, unspecified: Secondary | ICD-10-CM

## 2022-12-31 NOTE — Telephone Encounter (Signed)
Patient called back to confirm times for lab. Patient r/s to earlier times and notified.

## 2022-12-31 NOTE — Progress Notes (Signed)
Layne Benton RN, Navigator from [facility] [phone], sent fax and called to request that CBC, CMP and Mg be drawn on 2/6, 2/9 and 01/15/2023. Orders placed.

## 2022-12-31 NOTE — Telephone Encounter (Signed)
Called patient to confirm times for upcoming lab appointments. Left voicemail with new appointment details and direct contact information if changes need to be made.

## 2023-01-08 ENCOUNTER — Inpatient Hospital Stay: Payer: Medicare HMO | Attending: Physician Assistant

## 2023-01-08 ENCOUNTER — Other Ambulatory Visit: Payer: Self-pay

## 2023-01-08 ENCOUNTER — Inpatient Hospital Stay: Payer: Medicare HMO

## 2023-01-08 DIAGNOSIS — C7801 Secondary malignant neoplasm of right lung: Secondary | ICD-10-CM | POA: Diagnosis not present

## 2023-01-08 DIAGNOSIS — C7802 Secondary malignant neoplasm of left lung: Secondary | ICD-10-CM | POA: Diagnosis not present

## 2023-01-08 DIAGNOSIS — C499 Malignant neoplasm of connective and soft tissue, unspecified: Secondary | ICD-10-CM | POA: Diagnosis not present

## 2023-01-08 LAB — CBC WITH DIFFERENTIAL (CANCER CENTER ONLY)
Abs Immature Granulocytes: 0.02 10*3/uL (ref 0.00–0.07)
Basophils Absolute: 0 10*3/uL (ref 0.0–0.1)
Basophils Relative: 1 %
Eosinophils Absolute: 0.5 10*3/uL (ref 0.0–0.5)
Eosinophils Relative: 14 %
HCT: 37.4 % (ref 36.0–46.0)
Hemoglobin: 12.4 g/dL (ref 12.0–15.0)
Immature Granulocytes: 1 %
Lymphocytes Relative: 21 %
Lymphs Abs: 0.7 10*3/uL (ref 0.7–4.0)
MCH: 29.5 pg (ref 26.0–34.0)
MCHC: 33.2 g/dL (ref 30.0–36.0)
MCV: 89 fL (ref 80.0–100.0)
Monocytes Absolute: 0.1 10*3/uL (ref 0.1–1.0)
Monocytes Relative: 2 %
Neutro Abs: 2.2 10*3/uL (ref 1.7–7.7)
Neutrophils Relative %: 61 %
Platelet Count: 217 10*3/uL (ref 150–400)
RBC: 4.2 MIL/uL (ref 3.87–5.11)
RDW: 13.2 % (ref 11.5–15.5)
WBC Count: 3.5 10*3/uL — ABNORMAL LOW (ref 4.0–10.5)
nRBC: 0 % (ref 0.0–0.2)

## 2023-01-08 LAB — CMP (CANCER CENTER ONLY)
ALT: 47 U/L — ABNORMAL HIGH (ref 0–44)
AST: 28 U/L (ref 15–41)
Albumin: 3.8 g/dL (ref 3.5–5.0)
Alkaline Phosphatase: 68 U/L (ref 38–126)
Anion gap: 5 (ref 5–15)
BUN: 23 mg/dL (ref 8–23)
CO2: 29 mmol/L (ref 22–32)
Calcium: 10 mg/dL (ref 8.9–10.3)
Chloride: 104 mmol/L (ref 98–111)
Creatinine: 0.99 mg/dL (ref 0.44–1.00)
GFR, Estimated: >60 mL/min (ref >60)
Glucose, Bld: 88 mg/dL (ref 70–99)
Potassium: 4.8 mmol/L (ref 3.5–5.1)
Sodium: 138 mmol/L (ref 135–145)
Total Bilirubin: 0.7 mg/dL (ref 0.3–1.2)
Total Protein: 6.6 g/dL (ref 6.5–8.1)

## 2023-01-08 LAB — MAGNESIUM: Magnesium: 2 mg/dL (ref 1.7–2.4)

## 2023-01-11 ENCOUNTER — Inpatient Hospital Stay: Payer: Medicare HMO

## 2023-01-11 ENCOUNTER — Other Ambulatory Visit: Payer: Self-pay

## 2023-01-11 DIAGNOSIS — C499 Malignant neoplasm of connective and soft tissue, unspecified: Secondary | ICD-10-CM | POA: Diagnosis not present

## 2023-01-11 DIAGNOSIS — C7802 Secondary malignant neoplasm of left lung: Secondary | ICD-10-CM | POA: Diagnosis not present

## 2023-01-11 DIAGNOSIS — C7801 Secondary malignant neoplasm of right lung: Secondary | ICD-10-CM | POA: Diagnosis not present

## 2023-01-11 LAB — CBC WITH DIFFERENTIAL (CANCER CENTER ONLY)
Abs Immature Granulocytes: 0.01 10*3/uL (ref 0.00–0.07)
Basophils Absolute: 0 10*3/uL (ref 0.0–0.1)
Basophils Relative: 2 %
Eosinophils Absolute: 0.3 10*3/uL (ref 0.0–0.5)
Eosinophils Relative: 13 %
HCT: 35.6 % — ABNORMAL LOW (ref 36.0–46.0)
Hemoglobin: 12 g/dL (ref 12.0–15.0)
Immature Granulocytes: 1 %
Lymphocytes Relative: 45 %
Lymphs Abs: 1 10*3/uL (ref 0.7–4.0)
MCH: 29.9 pg (ref 26.0–34.0)
MCHC: 33.7 g/dL (ref 30.0–36.0)
MCV: 88.8 fL (ref 80.0–100.0)
Monocytes Absolute: 0.2 10*3/uL (ref 0.1–1.0)
Monocytes Relative: 7 %
Neutro Abs: 0.7 10*3/uL — ABNORMAL LOW (ref 1.7–7.7)
Neutrophils Relative %: 32 %
Platelet Count: 216 10*3/uL (ref 150–400)
RBC: 4.01 MIL/uL (ref 3.87–5.11)
RDW: 13.2 % (ref 11.5–15.5)
Smear Review: NORMAL
WBC Count: 2.1 10*3/uL — ABNORMAL LOW (ref 4.0–10.5)
nRBC: 0 % (ref 0.0–0.2)

## 2023-01-11 LAB — CMP (CANCER CENTER ONLY)
ALT: 25 U/L (ref 0–44)
AST: 21 U/L (ref 15–41)
Albumin: 3.7 g/dL (ref 3.5–5.0)
Alkaline Phosphatase: 54 U/L (ref 38–126)
Anion gap: 6 (ref 5–15)
BUN: 19 mg/dL (ref 8–23)
CO2: 27 mmol/L (ref 22–32)
Calcium: 9.6 mg/dL (ref 8.9–10.3)
Chloride: 105 mmol/L (ref 98–111)
Creatinine: 0.95 mg/dL (ref 0.44–1.00)
GFR, Estimated: >60 mL/min (ref >60)
Glucose, Bld: 90 mg/dL (ref 70–99)
Potassium: 4.2 mmol/L (ref 3.5–5.1)
Sodium: 138 mmol/L (ref 135–145)
Total Bilirubin: 0.7 mg/dL (ref 0.3–1.2)
Total Protein: 6.8 g/dL (ref 6.5–8.1)

## 2023-01-11 LAB — MAGNESIUM: Magnesium: 1.9 mg/dL (ref 1.7–2.4)

## 2023-01-15 ENCOUNTER — Inpatient Hospital Stay: Payer: Medicare HMO

## 2023-01-15 ENCOUNTER — Telehealth: Payer: Self-pay | Admitting: *Deleted

## 2023-01-15 ENCOUNTER — Other Ambulatory Visit: Payer: Self-pay

## 2023-01-15 DIAGNOSIS — C499 Malignant neoplasm of connective and soft tissue, unspecified: Secondary | ICD-10-CM | POA: Diagnosis not present

## 2023-01-15 DIAGNOSIS — C7802 Secondary malignant neoplasm of left lung: Secondary | ICD-10-CM | POA: Diagnosis not present

## 2023-01-15 DIAGNOSIS — C7801 Secondary malignant neoplasm of right lung: Secondary | ICD-10-CM | POA: Diagnosis not present

## 2023-01-15 LAB — CMP (CANCER CENTER ONLY)
ALT: 17 U/L (ref 0–44)
AST: 19 U/L (ref 15–41)
Albumin: 3.8 g/dL (ref 3.5–5.0)
Alkaline Phosphatase: 50 U/L (ref 38–126)
Anion gap: 6 (ref 5–15)
BUN: 23 mg/dL (ref 8–23)
CO2: 28 mmol/L (ref 22–32)
Calcium: 9.4 mg/dL (ref 8.9–10.3)
Chloride: 108 mmol/L (ref 98–111)
Creatinine: 0.98 mg/dL (ref 0.44–1.00)
GFR, Estimated: >60 mL/min (ref >60)
Glucose, Bld: 95 mg/dL (ref 70–99)
Potassium: 3.8 mmol/L (ref 3.5–5.1)
Sodium: 142 mmol/L (ref 135–145)
Total Bilirubin: 0.5 mg/dL (ref 0.3–1.2)
Total Protein: 6.5 g/dL (ref 6.5–8.1)

## 2023-01-15 LAB — CBC WITH DIFFERENTIAL (CANCER CENTER ONLY)
Abs Immature Granulocytes: 0 10*3/uL (ref 0.00–0.07)
Basophils Absolute: 0 10*3/uL (ref 0.0–0.1)
Basophils Relative: 2 %
Eosinophils Absolute: 0 10*3/uL (ref 0.0–0.5)
Eosinophils Relative: 2 %
HCT: 34.9 % — ABNORMAL LOW (ref 36.0–46.0)
Hemoglobin: 11.7 g/dL — ABNORMAL LOW (ref 12.0–15.0)
Immature Granulocytes: 0 %
Lymphocytes Relative: 54 %
Lymphs Abs: 1.1 10*3/uL (ref 0.7–4.0)
MCH: 30.2 pg (ref 26.0–34.0)
MCHC: 33.5 g/dL (ref 30.0–36.0)
MCV: 89.9 fL (ref 80.0–100.0)
Monocytes Absolute: 0.5 10*3/uL (ref 0.1–1.0)
Monocytes Relative: 23 %
Neutro Abs: 0.4 10*3/uL — CL (ref 1.7–7.7)
Neutrophils Relative %: 19 %
Platelet Count: 262 10*3/uL (ref 150–400)
RBC: 3.88 MIL/uL (ref 3.87–5.11)
RDW: 13.7 % (ref 11.5–15.5)
WBC Count: 2 10*3/uL — ABNORMAL LOW (ref 4.0–10.5)
nRBC: 0 % (ref 0.0–0.2)

## 2023-01-15 LAB — MAGNESIUM: Magnesium: 1.8 mg/dL (ref 1.7–2.4)

## 2023-01-15 NOTE — Telephone Encounter (Signed)
CRITICAL VALUE STICKER  CRITICAL VALUE: ANC 0.4  RECEIVER (on-site recipient of call): Sharlynn Oliphant, [place] NOTIFIED: 01/15/23 0840   MD NOTIFIED: Dr Lorenso Courier  TIME OF NOTIFICATION: [phone]  RESPONSE:  Will review

## 2023-01-16 DIAGNOSIS — C494 Malignant neoplasm of connective and soft tissue of abdomen: Secondary | ICD-10-CM | POA: Diagnosis not present

## 2023-01-18 DIAGNOSIS — C7802 Secondary malignant neoplasm of left lung: Secondary | ICD-10-CM | POA: Diagnosis not present

## 2023-01-18 DIAGNOSIS — C7801 Secondary malignant neoplasm of right lung: Secondary | ICD-10-CM | POA: Diagnosis not present

## 2023-01-18 DIAGNOSIS — N2889 Other specified disorders of kidney and ureter: Secondary | ICD-10-CM | POA: Diagnosis not present

## 2023-01-18 DIAGNOSIS — Z95828 Presence of other vascular implants and grafts: Secondary | ICD-10-CM | POA: Diagnosis not present

## 2023-01-18 DIAGNOSIS — C762 Malignant neoplasm of abdomen: Secondary | ICD-10-CM | POA: Diagnosis not present

## 2023-01-18 DIAGNOSIS — C499 Malignant neoplasm of connective and soft tissue, unspecified: Secondary | ICD-10-CM | POA: Diagnosis not present

## 2023-01-18 DIAGNOSIS — Z79632 Long term (current) use of antitumor antibiotic: Secondary | ICD-10-CM | POA: Diagnosis not present

## 2023-01-18 DIAGNOSIS — Z79630 Long term (current) use of alkylating agent: Secondary | ICD-10-CM | POA: Diagnosis not present

## 2023-01-18 DIAGNOSIS — Z7952 Long term (current) use of systemic steroids: Secondary | ICD-10-CM | POA: Diagnosis not present

## 2023-01-23 ENCOUNTER — Encounter: Payer: Self-pay | Admitting: Internal Medicine

## 2023-01-23 DIAGNOSIS — Z1231 Encounter for screening mammogram for malignant neoplasm of breast: Secondary | ICD-10-CM

## 2023-01-25 DIAGNOSIS — N2889 Other specified disorders of kidney and ureter: Secondary | ICD-10-CM | POA: Diagnosis not present

## 2023-01-25 DIAGNOSIS — C499 Malignant neoplasm of connective and soft tissue, unspecified: Secondary | ICD-10-CM | POA: Diagnosis not present

## 2023-01-25 DIAGNOSIS — R785 Finding of other psychotropic drug in blood: Secondary | ICD-10-CM | POA: Diagnosis not present

## 2023-01-25 DIAGNOSIS — I10 Essential (primary) hypertension: Secondary | ICD-10-CM | POA: Diagnosis not present

## 2023-01-25 DIAGNOSIS — E785 Hyperlipidemia, unspecified: Secondary | ICD-10-CM | POA: Diagnosis not present

## 2023-01-25 DIAGNOSIS — T451X5A Adverse effect of antineoplastic and immunosuppressive drugs, initial encounter: Secondary | ICD-10-CM | POA: Diagnosis not present

## 2023-01-25 DIAGNOSIS — R112 Nausea with vomiting, unspecified: Secondary | ICD-10-CM | POA: Diagnosis not present

## 2023-01-25 DIAGNOSIS — C7802 Secondary malignant neoplasm of left lung: Secondary | ICD-10-CM | POA: Diagnosis not present

## 2023-01-25 DIAGNOSIS — C642 Malignant neoplasm of left kidney, except renal pelvis: Secondary | ICD-10-CM | POA: Diagnosis not present

## 2023-01-25 DIAGNOSIS — D6481 Anemia due to antineoplastic chemotherapy: Secondary | ICD-10-CM | POA: Diagnosis not present

## 2023-01-25 DIAGNOSIS — Z95828 Presence of other vascular implants and grafts: Secondary | ICD-10-CM | POA: Diagnosis not present

## 2023-01-25 DIAGNOSIS — C762 Malignant neoplasm of abdomen: Secondary | ICD-10-CM | POA: Diagnosis not present

## 2023-01-25 DIAGNOSIS — R5383 Other fatigue: Secondary | ICD-10-CM | POA: Diagnosis not present

## 2023-01-25 DIAGNOSIS — Z5111 Encounter for antineoplastic chemotherapy: Secondary | ICD-10-CM | POA: Diagnosis not present

## 2023-01-25 DIAGNOSIS — C7800 Secondary malignant neoplasm of unspecified lung: Secondary | ICD-10-CM | POA: Diagnosis not present

## 2023-01-25 DIAGNOSIS — C7801 Secondary malignant neoplasm of right lung: Secondary | ICD-10-CM | POA: Diagnosis not present

## 2023-01-29 ENCOUNTER — Telehealth: Payer: Self-pay | Admitting: Hematology and Oncology

## 2023-01-29 ENCOUNTER — Other Ambulatory Visit: Payer: Self-pay | Admitting: Hematology and Oncology

## 2023-01-29 ENCOUNTER — Telehealth: Payer: Self-pay | Admitting: *Deleted

## 2023-01-29 ENCOUNTER — Other Ambulatory Visit: Payer: Self-pay | Admitting: *Deleted

## 2023-01-29 DIAGNOSIS — C499 Malignant neoplasm of connective and soft tissue, unspecified: Secondary | ICD-10-CM

## 2023-01-29 NOTE — Telephone Encounter (Signed)
Nurse Navigator w/[facility] sent fax that requested appts for patient following inpatient chemo at [facility]. Dr. Lorenso Courier informed and ordered Growth factor/pegfilgrastim. Labs ordered.  Following appts scheduled:  Injection appt for Growth Factor: 3/4/245 @ 1pm Follow up lab w/ port flush: 02/08/23 @ [place], RN nurse navigator with the above times. She will coordinate with Ms. Doub to inform patient. Faxed above information to Ms. Doub and Ms. Bush - fax confirmation received.

## 2023-01-29 NOTE — Telephone Encounter (Signed)
Called patient per 2/27 secure chat to schedule new appointments. Left voicemail with new appointment information and contact details if needing to reschedule.

## 2023-02-04 ENCOUNTER — Other Ambulatory Visit: Payer: Self-pay

## 2023-02-04 ENCOUNTER — Inpatient Hospital Stay: Payer: Medicare HMO

## 2023-02-04 ENCOUNTER — Inpatient Hospital Stay: Payer: Medicare HMO | Attending: Physician Assistant

## 2023-02-04 VITALS — BP 129/74 | HR 95 | Temp 98.7°F | Resp 16

## 2023-02-04 DIAGNOSIS — Z5189 Encounter for other specified aftercare: Secondary | ICD-10-CM | POA: Diagnosis not present

## 2023-02-04 DIAGNOSIS — C7801 Secondary malignant neoplasm of right lung: Secondary | ICD-10-CM | POA: Insufficient documentation

## 2023-02-04 DIAGNOSIS — C7989 Secondary malignant neoplasm of other specified sites: Secondary | ICD-10-CM

## 2023-02-04 DIAGNOSIS — C499 Malignant neoplasm of connective and soft tissue, unspecified: Secondary | ICD-10-CM | POA: Insufficient documentation

## 2023-02-04 DIAGNOSIS — C7802 Secondary malignant neoplasm of left lung: Secondary | ICD-10-CM | POA: Diagnosis not present

## 2023-02-04 MED ORDER — PEGFILGRASTIM-CBQV 6 MG/0.6ML ~~LOC~~ SOSY
6.0000 mg | PREFILLED_SYRINGE | Freq: Once | SUBCUTANEOUS | Status: AC
  Administered 2023-02-04: 6 mg via SUBCUTANEOUS
  Filled 2023-02-04: qty 0.6

## 2023-02-04 NOTE — Patient Instructions (Signed)

## 2023-02-08 ENCOUNTER — Other Ambulatory Visit: Payer: Self-pay

## 2023-02-08 ENCOUNTER — Inpatient Hospital Stay: Payer: Medicare HMO

## 2023-02-08 ENCOUNTER — Encounter: Payer: Self-pay | Admitting: Hematology and Oncology

## 2023-02-08 DIAGNOSIS — C7989 Secondary malignant neoplasm of other specified sites: Secondary | ICD-10-CM

## 2023-02-08 DIAGNOSIS — C499 Malignant neoplasm of connective and soft tissue, unspecified: Secondary | ICD-10-CM | POA: Diagnosis not present

## 2023-02-08 DIAGNOSIS — Z95828 Presence of other vascular implants and grafts: Secondary | ICD-10-CM | POA: Insufficient documentation

## 2023-02-08 DIAGNOSIS — C7802 Secondary malignant neoplasm of left lung: Secondary | ICD-10-CM | POA: Diagnosis not present

## 2023-02-08 DIAGNOSIS — C7801 Secondary malignant neoplasm of right lung: Secondary | ICD-10-CM | POA: Diagnosis not present

## 2023-02-08 DIAGNOSIS — Z5189 Encounter for other specified aftercare: Secondary | ICD-10-CM | POA: Diagnosis not present

## 2023-02-08 LAB — CMP (CANCER CENTER ONLY)
ALT: 18 U/L (ref 0–44)
AST: 20 U/L (ref 15–41)
Albumin: 4.1 g/dL (ref 3.5–5.0)
Alkaline Phosphatase: 69 U/L (ref 38–126)
Anion gap: 8 (ref 5–15)
BUN: 19 mg/dL (ref 8–23)
CO2: 28 mmol/L (ref 22–32)
Calcium: 9.5 mg/dL (ref 8.9–10.3)
Chloride: 103 mmol/L (ref 98–111)
Creatinine: 0.99 mg/dL (ref 0.44–1.00)
GFR, Estimated: >60 mL/min (ref >60)
Glucose, Bld: 111 mg/dL — ABNORMAL HIGH (ref 70–99)
Potassium: 3.9 mmol/L (ref 3.5–5.1)
Sodium: 139 mmol/L (ref 135–145)
Total Bilirubin: 0.4 mg/dL (ref 0.3–1.2)
Total Protein: 6.9 g/dL (ref 6.5–8.1)

## 2023-02-08 LAB — CBC WITH DIFFERENTIAL (CANCER CENTER ONLY)
Abs Immature Granulocytes: 0.57 10*3/uL — ABNORMAL HIGH (ref 0.00–0.07)
Basophils Absolute: 0.1 10*3/uL (ref 0.0–0.1)
Basophils Relative: 2 %
Eosinophils Absolute: 0.6 10*3/uL — ABNORMAL HIGH (ref 0.0–0.5)
Eosinophils Relative: 9 %
HCT: 33.8 % — ABNORMAL LOW (ref 36.0–46.0)
Hemoglobin: 11.8 g/dL — ABNORMAL LOW (ref 12.0–15.0)
Immature Granulocytes: 9 %
Lymphocytes Relative: 22 %
Lymphs Abs: 1.4 10*3/uL (ref 0.7–4.0)
MCH: 31.5 pg (ref 26.0–34.0)
MCHC: 34.9 g/dL (ref 30.0–36.0)
MCV: 90.1 fL (ref 80.0–100.0)
Monocytes Absolute: 1.2 10*3/uL — ABNORMAL HIGH (ref 0.1–1.0)
Monocytes Relative: 19 %
Neutro Abs: 2.5 10*3/uL (ref 1.7–7.7)
Neutrophils Relative %: 39 %
Platelet Count: 210 10*3/uL (ref 150–400)
RBC: 3.75 MIL/uL — ABNORMAL LOW (ref 3.87–5.11)
RDW: 14.7 % (ref 11.5–15.5)
WBC Count: 6.4 10*3/uL (ref 4.0–10.5)
nRBC: 0 % (ref 0.0–0.2)

## 2023-02-08 LAB — MAGNESIUM: Magnesium: 1.7 mg/dL (ref 1.7–2.4)

## 2023-02-08 MED ORDER — HEPARIN SOD (PORK) LOCK FLUSH 100 UNIT/ML IV SOLN: 500.0000 [IU] | Freq: Once | INTRAVENOUS | Status: DC

## 2023-02-08 MED ORDER — SODIUM CHLORIDE 0.9% FLUSH: 10.0000 mL | Freq: Once | INTRAVENOUS | Status: DC

## 2023-02-08 NOTE — Progress Notes (Signed)
Faxed labs from 02/08/23 to Marlana Latus RN at [facility].

## 2023-02-12 DIAGNOSIS — T80212A Local infection due to central venous catheter, initial encounter: Secondary | ICD-10-CM | POA: Diagnosis not present

## 2023-02-12 DIAGNOSIS — Z95828 Presence of other vascular implants and grafts: Secondary | ICD-10-CM | POA: Diagnosis not present

## 2023-02-12 DIAGNOSIS — C499 Malignant neoplasm of connective and soft tissue, unspecified: Secondary | ICD-10-CM | POA: Diagnosis not present

## 2023-02-12 DIAGNOSIS — C7801 Secondary malignant neoplasm of right lung: Secondary | ICD-10-CM | POA: Diagnosis not present

## 2023-02-12 DIAGNOSIS — C7802 Secondary malignant neoplasm of left lung: Secondary | ICD-10-CM | POA: Diagnosis not present

## 2023-02-12 DIAGNOSIS — I10 Essential (primary) hypertension: Secondary | ICD-10-CM | POA: Diagnosis not present

## 2023-02-12 DIAGNOSIS — Z5111 Encounter for antineoplastic chemotherapy: Secondary | ICD-10-CM | POA: Diagnosis not present

## 2023-02-12 DIAGNOSIS — C649 Malignant neoplasm of unspecified kidney, except renal pelvis: Secondary | ICD-10-CM | POA: Diagnosis not present

## 2023-02-12 DIAGNOSIS — L03818 Cellulitis of other sites: Secondary | ICD-10-CM | POA: Diagnosis not present

## 2023-02-12 DIAGNOSIS — E785 Hyperlipidemia, unspecified: Secondary | ICD-10-CM | POA: Diagnosis not present

## 2023-02-12 DIAGNOSIS — R21 Rash and other nonspecific skin eruption: Secondary | ICD-10-CM | POA: Diagnosis not present

## 2023-02-12 DIAGNOSIS — D6481 Anemia due to antineoplastic chemotherapy: Secondary | ICD-10-CM | POA: Diagnosis not present

## 2023-02-12 DIAGNOSIS — L03313 Cellulitis of chest wall: Secondary | ICD-10-CM | POA: Diagnosis not present

## 2023-02-12 DIAGNOSIS — K219 Gastro-esophageal reflux disease without esophagitis: Secondary | ICD-10-CM | POA: Diagnosis not present

## 2023-02-19 ENCOUNTER — Other Ambulatory Visit: Payer: Self-pay | Admitting: *Deleted

## 2023-02-19 ENCOUNTER — Telehealth: Payer: Self-pay | Admitting: Hematology and Oncology

## 2023-02-19 DIAGNOSIS — C499 Malignant neoplasm of connective and soft tissue, unspecified: Secondary | ICD-10-CM

## 2023-02-19 NOTE — Telephone Encounter (Signed)
Returned voicemail about needing to schedule patient for next injection and lab work. Scheduled patient and left voicemail with appointment details and contact information if needing to reschedule.

## 2023-02-25 ENCOUNTER — Other Ambulatory Visit: Payer: Self-pay

## 2023-02-25 ENCOUNTER — Inpatient Hospital Stay: Payer: Medicare HMO

## 2023-02-25 VITALS — BP 119/72 | HR 88 | Temp 98.1°F | Resp 16

## 2023-02-25 DIAGNOSIS — C499 Malignant neoplasm of connective and soft tissue, unspecified: Secondary | ICD-10-CM | POA: Diagnosis not present

## 2023-02-25 DIAGNOSIS — C7801 Secondary malignant neoplasm of right lung: Secondary | ICD-10-CM | POA: Diagnosis not present

## 2023-02-25 DIAGNOSIS — C7802 Secondary malignant neoplasm of left lung: Secondary | ICD-10-CM | POA: Diagnosis not present

## 2023-02-25 DIAGNOSIS — Z95828 Presence of other vascular implants and grafts: Secondary | ICD-10-CM

## 2023-02-25 DIAGNOSIS — Z5189 Encounter for other specified aftercare: Secondary | ICD-10-CM | POA: Diagnosis not present

## 2023-02-25 DIAGNOSIS — C7989 Secondary malignant neoplasm of other specified sites: Secondary | ICD-10-CM

## 2023-02-25 MED ORDER — PEGFILGRASTIM-CBQV 6 MG/0.6ML ~~LOC~~ SOSY
6.0000 mg | PREFILLED_SYRINGE | Freq: Once | SUBCUTANEOUS | Status: AC
  Administered 2023-02-25: 6 mg via SUBCUTANEOUS
  Filled 2023-02-25: qty 0.6

## 2023-02-25 NOTE — Patient Instructions (Signed)

## 2023-03-01 ENCOUNTER — Other Ambulatory Visit: Payer: Self-pay

## 2023-03-01 ENCOUNTER — Inpatient Hospital Stay: Payer: Medicare HMO

## 2023-03-01 DIAGNOSIS — C499 Malignant neoplasm of connective and soft tissue, unspecified: Secondary | ICD-10-CM | POA: Diagnosis not present

## 2023-03-01 DIAGNOSIS — C7801 Secondary malignant neoplasm of right lung: Secondary | ICD-10-CM | POA: Diagnosis not present

## 2023-03-01 DIAGNOSIS — C7802 Secondary malignant neoplasm of left lung: Secondary | ICD-10-CM | POA: Diagnosis not present

## 2023-03-01 DIAGNOSIS — Z5189 Encounter for other specified aftercare: Secondary | ICD-10-CM | POA: Diagnosis not present

## 2023-03-01 LAB — CMP (CANCER CENTER ONLY)
ALT: 10 U/L (ref 0–44)
AST: 18 U/L (ref 15–41)
Albumin: 3.9 g/dL (ref 3.5–5.0)
Alkaline Phosphatase: 59 U/L (ref 38–126)
Anion gap: 6 (ref 5–15)
BUN: 22 mg/dL (ref 8–23)
CO2: 29 mmol/L (ref 22–32)
Calcium: 9.5 mg/dL (ref 8.9–10.3)
Chloride: 105 mmol/L (ref 98–111)
Creatinine: 0.95 mg/dL (ref 0.44–1.00)
GFR, Estimated: >60 mL/min (ref >60)
Glucose, Bld: 109 mg/dL — ABNORMAL HIGH (ref 70–99)
Potassium: 4.4 mmol/L (ref 3.5–5.1)
Sodium: 140 mmol/L (ref 135–145)
Total Bilirubin: 0.5 mg/dL (ref 0.3–1.2)
Total Protein: 6.6 g/dL (ref 6.5–8.1)

## 2023-03-01 LAB — CBC WITH DIFFERENTIAL (CANCER CENTER ONLY)
Abs Immature Granulocytes: 0.04 10*3/uL (ref 0.00–0.07)
Basophils Absolute: 0 10*3/uL (ref 0.0–0.1)
Basophils Relative: 1 %
Eosinophils Absolute: 0.2 10*3/uL (ref 0.0–0.5)
Eosinophils Relative: 7 %
HCT: 30.1 % — ABNORMAL LOW (ref 36.0–46.0)
Hemoglobin: 10.2 g/dL — ABNORMAL LOW (ref 12.0–15.0)
Immature Granulocytes: 1 %
Lymphocytes Relative: 20 %
Lymphs Abs: 0.6 10*3/uL — ABNORMAL LOW (ref 0.7–4.0)
MCH: 30.9 pg (ref 26.0–34.0)
MCHC: 33.9 g/dL (ref 30.0–36.0)
MCV: 91.2 fL (ref 80.0–100.0)
Monocytes Absolute: 0.8 10*3/uL (ref 0.1–1.0)
Monocytes Relative: 24 %
Neutro Abs: 1.5 10*3/uL — ABNORMAL LOW (ref 1.7–7.7)
Neutrophils Relative %: 47 %
Platelet Count: 170 10*3/uL (ref 150–400)
RBC: 3.3 MIL/uL — ABNORMAL LOW (ref 3.87–5.11)
RDW: 16.5 % — ABNORMAL HIGH (ref 11.5–15.5)
Smear Review: NORMAL
WBC Count: 3.2 10*3/uL — ABNORMAL LOW (ref 4.0–10.5)
nRBC: 0 % (ref 0.0–0.2)

## 2023-03-01 LAB — MAGNESIUM: Magnesium: 1.8 mg/dL (ref 1.7–2.4)

## 2023-03-08 DIAGNOSIS — C642 Malignant neoplasm of left kidney, except renal pelvis: Secondary | ICD-10-CM | POA: Diagnosis not present

## 2023-03-08 DIAGNOSIS — I10 Essential (primary) hypertension: Secondary | ICD-10-CM | POA: Diagnosis not present

## 2023-03-08 DIAGNOSIS — Z95828 Presence of other vascular implants and grafts: Secondary | ICD-10-CM | POA: Diagnosis not present

## 2023-03-08 DIAGNOSIS — C499 Malignant neoplasm of connective and soft tissue, unspecified: Secondary | ICD-10-CM | POA: Diagnosis not present

## 2023-03-08 DIAGNOSIS — C494 Malignant neoplasm of connective and soft tissue of abdomen: Secondary | ICD-10-CM | POA: Diagnosis not present

## 2023-03-08 DIAGNOSIS — I959 Hypotension, unspecified: Secondary | ICD-10-CM | POA: Diagnosis not present

## 2023-03-08 DIAGNOSIS — C7802 Secondary malignant neoplasm of left lung: Secondary | ICD-10-CM | POA: Diagnosis not present

## 2023-03-08 DIAGNOSIS — K219 Gastro-esophageal reflux disease without esophagitis: Secondary | ICD-10-CM | POA: Diagnosis not present

## 2023-03-08 DIAGNOSIS — C7801 Secondary malignant neoplasm of right lung: Secondary | ICD-10-CM | POA: Diagnosis not present

## 2023-03-08 DIAGNOSIS — Z5111 Encounter for antineoplastic chemotherapy: Secondary | ICD-10-CM | POA: Diagnosis not present

## 2023-03-08 DIAGNOSIS — C649 Malignant neoplasm of unspecified kidney, except renal pelvis: Secondary | ICD-10-CM | POA: Diagnosis not present

## 2023-03-08 DIAGNOSIS — E785 Hyperlipidemia, unspecified: Secondary | ICD-10-CM | POA: Diagnosis not present

## 2023-03-08 DIAGNOSIS — C7800 Secondary malignant neoplasm of unspecified lung: Secondary | ICD-10-CM | POA: Diagnosis not present

## 2023-03-08 DIAGNOSIS — D6481 Anemia due to antineoplastic chemotherapy: Secondary | ICD-10-CM | POA: Diagnosis not present

## 2023-03-11 ENCOUNTER — Other Ambulatory Visit: Payer: Self-pay | Admitting: *Deleted

## 2023-03-11 ENCOUNTER — Telehealth: Payer: Self-pay | Admitting: Hematology and Oncology

## 2023-03-11 DIAGNOSIS — C7989 Secondary malignant neoplasm of other specified sites: Secondary | ICD-10-CM

## 2023-03-11 NOTE — Telephone Encounter (Signed)
Contacted patient to scheduled appointments. Patient is aware of appointments that are scheduled.   

## 2023-03-12 ENCOUNTER — Other Ambulatory Visit: Payer: Self-pay | Admitting: Hematology and Oncology

## 2023-03-18 ENCOUNTER — Inpatient Hospital Stay: Payer: Medicare HMO | Attending: Physician Assistant

## 2023-03-18 ENCOUNTER — Other Ambulatory Visit: Payer: Self-pay

## 2023-03-18 VITALS — BP 107/67 | HR 92 | Temp 98.3°F | Resp 16

## 2023-03-18 DIAGNOSIS — C499 Malignant neoplasm of connective and soft tissue, unspecified: Secondary | ICD-10-CM | POA: Diagnosis not present

## 2023-03-18 DIAGNOSIS — Z5189 Encounter for other specified aftercare: Secondary | ICD-10-CM | POA: Insufficient documentation

## 2023-03-18 DIAGNOSIS — Z95828 Presence of other vascular implants and grafts: Secondary | ICD-10-CM

## 2023-03-18 DIAGNOSIS — C7800 Secondary malignant neoplasm of unspecified lung: Secondary | ICD-10-CM | POA: Diagnosis not present

## 2023-03-18 DIAGNOSIS — C7989 Secondary malignant neoplasm of other specified sites: Secondary | ICD-10-CM

## 2023-03-18 MED ORDER — PEGFILGRASTIM-CBQV 6 MG/0.6ML ~~LOC~~ SOSY
6.0000 mg | PREFILLED_SYRINGE | Freq: Once | SUBCUTANEOUS | Status: AC
  Administered 2023-03-18: 6 mg via SUBCUTANEOUS
  Filled 2023-03-18: qty 0.6

## 2023-03-22 ENCOUNTER — Other Ambulatory Visit: Payer: Self-pay

## 2023-03-22 ENCOUNTER — Inpatient Hospital Stay: Payer: Medicare HMO

## 2023-03-22 DIAGNOSIS — C7800 Secondary malignant neoplasm of unspecified lung: Secondary | ICD-10-CM | POA: Diagnosis not present

## 2023-03-22 DIAGNOSIS — Z5189 Encounter for other specified aftercare: Secondary | ICD-10-CM | POA: Diagnosis not present

## 2023-03-22 DIAGNOSIS — C499 Malignant neoplasm of connective and soft tissue, unspecified: Secondary | ICD-10-CM | POA: Diagnosis not present

## 2023-03-22 DIAGNOSIS — C7989 Secondary malignant neoplasm of other specified sites: Secondary | ICD-10-CM

## 2023-03-22 LAB — CBC WITH DIFFERENTIAL (CANCER CENTER ONLY)
Abs Immature Granulocytes: 0.01 10*3/uL (ref 0.00–0.07)
Basophils Absolute: 0 10*3/uL (ref 0.0–0.1)
Basophils Relative: 0 %
Eosinophils Absolute: 0 10*3/uL (ref 0.0–0.5)
Eosinophils Relative: 3 %
HCT: 27.8 % — ABNORMAL LOW (ref 36.0–46.0)
Hemoglobin: 9.4 g/dL — ABNORMAL LOW (ref 12.0–15.0)
Immature Granulocytes: 1 %
Lymphocytes Relative: 23 %
Lymphs Abs: 0.3 10*3/uL — ABNORMAL LOW (ref 0.7–4.0)
MCH: 32.3 pg (ref 26.0–34.0)
MCHC: 33.8 g/dL (ref 30.0–36.0)
MCV: 95.5 fL (ref 80.0–100.0)
Monocytes Absolute: 0.5 10*3/uL (ref 0.1–1.0)
Monocytes Relative: 34 %
Neutro Abs: 0.6 10*3/uL — ABNORMAL LOW (ref 1.7–7.7)
Neutrophils Relative %: 39 %
Platelet Count: 112 10*3/uL — ABNORMAL LOW (ref 150–400)
RBC: 2.91 MIL/uL — ABNORMAL LOW (ref 3.87–5.11)
RDW: 19.2 % — ABNORMAL HIGH (ref 11.5–15.5)
Smear Review: NORMAL
WBC Count: 1.5 10*3/uL — ABNORMAL LOW (ref 4.0–10.5)
nRBC: 0 % (ref 0.0–0.2)

## 2023-03-22 LAB — CMP (CANCER CENTER ONLY)
ALT: 10 U/L (ref 0–44)
AST: 19 U/L (ref 15–41)
Albumin: 4.1 g/dL (ref 3.5–5.0)
Alkaline Phosphatase: 49 U/L (ref 38–126)
Anion gap: 8 (ref 5–15)
BUN: 22 mg/dL (ref 8–23)
CO2: 26 mmol/L (ref 22–32)
Calcium: 10 mg/dL (ref 8.9–10.3)
Chloride: 106 mmol/L (ref 98–111)
Creatinine: 0.99 mg/dL (ref 0.44–1.00)
GFR, Estimated: >60 mL/min (ref >60)
Glucose, Bld: 95 mg/dL (ref 70–99)
Potassium: 4.1 mmol/L (ref 3.5–5.1)
Sodium: 140 mmol/L (ref 135–145)
Total Bilirubin: 0.6 mg/dL (ref 0.3–1.2)
Total Protein: 6.9 g/dL (ref 6.5–8.1)

## 2023-03-22 LAB — MAGNESIUM: Magnesium: 1.8 mg/dL (ref 1.7–2.4)

## 2023-03-29 DIAGNOSIS — C7801 Secondary malignant neoplasm of right lung: Secondary | ICD-10-CM | POA: Diagnosis not present

## 2023-03-29 DIAGNOSIS — C499 Malignant neoplasm of connective and soft tissue, unspecified: Secondary | ICD-10-CM | POA: Diagnosis not present

## 2023-03-29 DIAGNOSIS — C7802 Secondary malignant neoplasm of left lung: Secondary | ICD-10-CM | POA: Diagnosis not present

## 2023-03-29 DIAGNOSIS — Z95828 Presence of other vascular implants and grafts: Secondary | ICD-10-CM | POA: Diagnosis not present

## 2023-03-29 DIAGNOSIS — N289 Disorder of kidney and ureter, unspecified: Secondary | ICD-10-CM | POA: Diagnosis not present

## 2023-04-04 ENCOUNTER — Telehealth: Payer: Self-pay | Admitting: *Deleted

## 2023-04-04 ENCOUNTER — Telehealth: Payer: Self-pay | Admitting: Hematology and Oncology

## 2023-04-04 ENCOUNTER — Inpatient Hospital Stay: Payer: Medicare HMO | Attending: Physician Assistant

## 2023-04-04 ENCOUNTER — Other Ambulatory Visit: Payer: Self-pay | Admitting: *Deleted

## 2023-04-04 ENCOUNTER — Other Ambulatory Visit: Payer: Self-pay

## 2023-04-04 DIAGNOSIS — Z5189 Encounter for other specified aftercare: Secondary | ICD-10-CM | POA: Diagnosis not present

## 2023-04-04 DIAGNOSIS — C499 Malignant neoplasm of connective and soft tissue, unspecified: Secondary | ICD-10-CM | POA: Diagnosis not present

## 2023-04-04 LAB — CBC WITH DIFFERENTIAL (CANCER CENTER ONLY)
Abs Immature Granulocytes: 0.02 10*3/uL (ref 0.00–0.07)
Basophils Absolute: 0 10*3/uL (ref 0.0–0.1)
Basophils Relative: 1 %
Eosinophils Absolute: 0 10*3/uL (ref 0.0–0.5)
Eosinophils Relative: 0 %
HCT: 27.1 % — ABNORMAL LOW (ref 36.0–46.0)
Hemoglobin: 9.3 g/dL — ABNORMAL LOW (ref 12.0–15.0)
Immature Granulocytes: 0 %
Lymphocytes Relative: 9 %
Lymphs Abs: 0.5 10*3/uL — ABNORMAL LOW (ref 0.7–4.0)
MCH: 34.1 pg — ABNORMAL HIGH (ref 26.0–34.0)
MCHC: 34.3 g/dL (ref 30.0–36.0)
MCV: 99.3 fL (ref 80.0–100.0)
Monocytes Absolute: 0.8 10*3/uL (ref 0.1–1.0)
Monocytes Relative: 15 %
Neutro Abs: 3.8 10*3/uL (ref 1.7–7.7)
Neutrophils Relative %: 75 %
Platelet Count: 137 10*3/uL — ABNORMAL LOW (ref 150–400)
RBC: 2.73 MIL/uL — ABNORMAL LOW (ref 3.87–5.11)
RDW: 22.5 % — ABNORMAL HIGH (ref 11.5–15.5)
WBC Count: 5.1 10*3/uL (ref 4.0–10.5)
nRBC: 0 % (ref 0.0–0.2)

## 2023-04-04 LAB — CMP (CANCER CENTER ONLY)
ALT: 8 U/L (ref 0–44)
AST: 17 U/L (ref 15–41)
Albumin: 4 g/dL (ref 3.5–5.0)
Alkaline Phosphatase: 54 U/L (ref 38–126)
Anion gap: 7 (ref 5–15)
BUN: 20 mg/dL (ref 8–23)
CO2: 26 mmol/L (ref 22–32)
Calcium: 9.4 mg/dL (ref 8.9–10.3)
Chloride: 107 mmol/L (ref 98–111)
Creatinine: 0.94 mg/dL (ref 0.44–1.00)
GFR, Estimated: >60 mL/min (ref >60)
Glucose, Bld: 105 mg/dL — ABNORMAL HIGH (ref 70–99)
Potassium: 4 mmol/L (ref 3.5–5.1)
Sodium: 140 mmol/L (ref 135–145)
Total Bilirubin: 0.4 mg/dL (ref 0.3–1.2)
Total Protein: 6.7 g/dL (ref 6.5–8.1)

## 2023-04-04 LAB — MAGNESIUM: Magnesium: 1.7 mg/dL (ref 1.7–2.4)

## 2023-04-04 NOTE — Telephone Encounter (Signed)
TCT patient regarding today's lab results. Spoke with pt. Advised that her anemia is stable and plts are improving compared to two weeks ago. Also advised that her WBC and ANC have rebounded as well. Pt states she is scheduled to be admitted to [facility] tomorrow for her 5 days of inpatient chemo.   Fax'd results to [facility].

## 2023-04-04 NOTE — Telephone Encounter (Signed)
-----   Message from Briant Cedar, PA-C sent at 04/04/2023  2:40 PM EDT ----- Please review labs with patient. Anemia is stable and plts are improving compared to two weeks ago.    ----- Message ----- From: Interface, Lab In [place] Sent: 04/04/2023   1:18 PM EDT To: Jaci Standard, MD

## 2023-04-04 NOTE — Telephone Encounter (Signed)
Patient left voicemail to verify time of her appointment, reached out to patient  to make her aware.

## 2023-04-04 NOTE — Telephone Encounter (Signed)
Received call from pt. She states that she is to have a lab appt here per her providers at [facility]. Advised that I have not received any communication from them this week. Pt states her platelets were low on 03/29/23. Advised that I will schedule her for labs today  for 1pm per her request. Pt said she will call [facility] to have them fax their orders for lab appts etc.  Lab orders placed.' Scheduling message sent.

## 2023-04-05 DIAGNOSIS — E785 Hyperlipidemia, unspecified: Secondary | ICD-10-CM | POA: Diagnosis not present

## 2023-04-05 DIAGNOSIS — C7989 Secondary malignant neoplasm of other specified sites: Secondary | ICD-10-CM | POA: Diagnosis not present

## 2023-04-05 DIAGNOSIS — C7900 Secondary malignant neoplasm of unspecified kidney and renal pelvis: Secondary | ICD-10-CM | POA: Diagnosis not present

## 2023-04-05 DIAGNOSIS — D6481 Anemia due to antineoplastic chemotherapy: Secondary | ICD-10-CM | POA: Diagnosis not present

## 2023-04-05 DIAGNOSIS — D701 Agranulocytosis secondary to cancer chemotherapy: Secondary | ICD-10-CM | POA: Diagnosis not present

## 2023-04-05 DIAGNOSIS — I2510 Atherosclerotic heart disease of native coronary artery without angina pectoris: Secondary | ICD-10-CM | POA: Diagnosis not present

## 2023-04-05 DIAGNOSIS — I10 Essential (primary) hypertension: Secondary | ICD-10-CM | POA: Diagnosis not present

## 2023-04-05 DIAGNOSIS — C499 Malignant neoplasm of connective and soft tissue, unspecified: Secondary | ICD-10-CM | POA: Diagnosis not present

## 2023-04-05 DIAGNOSIS — Z95828 Presence of other vascular implants and grafts: Secondary | ICD-10-CM | POA: Diagnosis not present

## 2023-04-05 DIAGNOSIS — C7800 Secondary malignant neoplasm of unspecified lung: Secondary | ICD-10-CM | POA: Diagnosis not present

## 2023-04-05 DIAGNOSIS — C7802 Secondary malignant neoplasm of left lung: Secondary | ICD-10-CM | POA: Diagnosis not present

## 2023-04-05 DIAGNOSIS — Z5111 Encounter for antineoplastic chemotherapy: Secondary | ICD-10-CM | POA: Diagnosis not present

## 2023-04-05 DIAGNOSIS — C7801 Secondary malignant neoplasm of right lung: Secondary | ICD-10-CM | POA: Diagnosis not present

## 2023-04-08 ENCOUNTER — Other Ambulatory Visit: Payer: Self-pay | Admitting: Physician Assistant

## 2023-04-08 ENCOUNTER — Other Ambulatory Visit: Payer: Self-pay | Admitting: *Deleted

## 2023-04-08 ENCOUNTER — Telehealth: Payer: Self-pay | Admitting: Hematology and Oncology

## 2023-04-08 DIAGNOSIS — C499 Malignant neoplasm of connective and soft tissue, unspecified: Secondary | ICD-10-CM

## 2023-04-15 ENCOUNTER — Other Ambulatory Visit: Payer: Self-pay

## 2023-04-15 ENCOUNTER — Inpatient Hospital Stay: Payer: Medicare HMO

## 2023-04-15 DIAGNOSIS — Z5189 Encounter for other specified aftercare: Secondary | ICD-10-CM | POA: Diagnosis not present

## 2023-04-15 DIAGNOSIS — C7989 Secondary malignant neoplasm of other specified sites: Secondary | ICD-10-CM

## 2023-04-15 DIAGNOSIS — C499 Malignant neoplasm of connective and soft tissue, unspecified: Secondary | ICD-10-CM | POA: Diagnosis not present

## 2023-04-15 DIAGNOSIS — Z95828 Presence of other vascular implants and grafts: Secondary | ICD-10-CM

## 2023-04-15 LAB — CMP (CANCER CENTER ONLY)
ALT: 8 U/L (ref 0–44)
AST: 21 U/L (ref 15–41)
Albumin: 3.9 g/dL (ref 3.5–5.0)
Alkaline Phosphatase: 47 U/L (ref 38–126)
Anion gap: 8 (ref 5–15)
BUN: 23 mg/dL (ref 8–23)
CO2: 26 mmol/L (ref 22–32)
Calcium: 9.1 mg/dL (ref 8.9–10.3)
Chloride: 105 mmol/L (ref 98–111)
Creatinine: 0.98 mg/dL (ref 0.44–1.00)
GFR, Estimated: >60 mL/min (ref >60)
Glucose, Bld: 100 mg/dL — ABNORMAL HIGH (ref 70–99)
Potassium: 4.5 mmol/L (ref 3.5–5.1)
Sodium: 139 mmol/L (ref 135–145)
Total Bilirubin: 0.6 mg/dL (ref 0.3–1.2)
Total Protein: 6.4 g/dL — ABNORMAL LOW (ref 6.5–8.1)

## 2023-04-15 LAB — CBC WITH DIFFERENTIAL (CANCER CENTER ONLY)
Abs Immature Granulocytes: 0.01 10*3/uL (ref 0.00–0.07)
Basophils Absolute: 0 10*3/uL (ref 0.0–0.1)
Basophils Relative: 1 %
Eosinophils Absolute: 0 10*3/uL (ref 0.0–0.5)
Eosinophils Relative: 1 %
HCT: 26 % — ABNORMAL LOW (ref 36.0–46.0)
Hemoglobin: 8.8 g/dL — ABNORMAL LOW (ref 12.0–15.0)
Immature Granulocytes: 1 %
Lymphocytes Relative: 9 %
Lymphs Abs: 0.2 10*3/uL — ABNORMAL LOW (ref 0.7–4.0)
MCH: 34.1 pg — ABNORMAL HIGH (ref 26.0–34.0)
MCHC: 33.8 g/dL (ref 30.0–36.0)
MCV: 100.8 fL — ABNORMAL HIGH (ref 80.0–100.0)
Monocytes Absolute: 0.1 10*3/uL (ref 0.1–1.0)
Monocytes Relative: 4 %
Neutro Abs: 1.9 10*3/uL (ref 1.7–7.7)
Neutrophils Relative %: 84 %
Platelet Count: 205 10*3/uL (ref 150–400)
RBC: 2.58 MIL/uL — ABNORMAL LOW (ref 3.87–5.11)
RDW: 21.8 % — ABNORMAL HIGH (ref 11.5–15.5)
WBC Count: 2.2 10*3/uL — ABNORMAL LOW (ref 4.0–10.5)
nRBC: 0 % (ref 0.0–0.2)

## 2023-04-15 LAB — MAGNESIUM: Magnesium: 1.9 mg/dL (ref 1.7–2.4)

## 2023-04-15 MED ORDER — SODIUM CHLORIDE 0.9% FLUSH: 10.0000 mL | Freq: Once | INTRAVENOUS | Status: DC

## 2023-04-15 MED ORDER — HEPARIN SOD (PORK) LOCK FLUSH 100 UNIT/ML IV SOLN: 500.0000 [IU] | Freq: Once | INTRAVENOUS | Status: DC

## 2023-04-15 MED ORDER — PEGFILGRASTIM-CBQV 6 MG/0.6ML ~~LOC~~ SOSY
6.0000 mg | PREFILLED_SYRINGE | Freq: Once | SUBCUTANEOUS | Status: AC
  Administered 2023-04-15: 6 mg via SUBCUTANEOUS
  Filled 2023-04-15: qty 0.6

## 2023-04-15 NOTE — Progress Notes (Signed)
Patient scheduled for port flush with labs but she refused and said she normally gets it peripherally. Udenyca injection given as scheduled and Lab appointment made.

## 2023-04-17 DIAGNOSIS — C499 Malignant neoplasm of connective and soft tissue, unspecified: Secondary | ICD-10-CM | POA: Diagnosis not present

## 2023-04-17 DIAGNOSIS — C7800 Secondary malignant neoplasm of unspecified lung: Secondary | ICD-10-CM | POA: Diagnosis not present

## 2023-04-17 DIAGNOSIS — E669 Obesity, unspecified: Secondary | ICD-10-CM | POA: Diagnosis not present

## 2023-04-17 DIAGNOSIS — I10 Essential (primary) hypertension: Secondary | ICD-10-CM | POA: Diagnosis not present

## 2023-04-19 ENCOUNTER — Telehealth: Payer: Self-pay | Admitting: *Deleted

## 2023-04-19 ENCOUNTER — Inpatient Hospital Stay: Payer: Medicare HMO

## 2023-04-19 ENCOUNTER — Telehealth: Payer: Self-pay

## 2023-04-19 ENCOUNTER — Other Ambulatory Visit: Payer: Self-pay

## 2023-04-19 DIAGNOSIS — C499 Malignant neoplasm of connective and soft tissue, unspecified: Secondary | ICD-10-CM | POA: Diagnosis not present

## 2023-04-19 DIAGNOSIS — Z5189 Encounter for other specified aftercare: Secondary | ICD-10-CM | POA: Diagnosis not present

## 2023-04-19 LAB — CBC WITH DIFFERENTIAL (CANCER CENTER ONLY)
Abs Immature Granulocytes: 0.08 10*3/uL — ABNORMAL HIGH (ref 0.00–0.07)
Basophils Absolute: 0 10*3/uL (ref 0.0–0.1)
Basophils Relative: 1 %
Eosinophils Absolute: 0 10*3/uL (ref 0.0–0.5)
Eosinophils Relative: 2 %
HCT: 24.9 % — ABNORMAL LOW (ref 36.0–46.0)
Hemoglobin: 8.5 g/dL — ABNORMAL LOW (ref 12.0–15.0)
Immature Granulocytes: 8 %
Lymphocytes Relative: 21 %
Lymphs Abs: 0.2 10*3/uL — ABNORMAL LOW (ref 0.7–4.0)
MCH: 34.4 pg — ABNORMAL HIGH (ref 26.0–34.0)
MCHC: 34.1 g/dL (ref 30.0–36.0)
MCV: 100.8 fL — ABNORMAL HIGH (ref 80.0–100.0)
Monocytes Absolute: 0.3 10*3/uL (ref 0.1–1.0)
Monocytes Relative: 32 %
Neutro Abs: 0.4 10*3/uL — CL (ref 1.7–7.7)
Neutrophils Relative %: 36 %
Platelet Count: 89 10*3/uL — ABNORMAL LOW (ref 150–400)
RBC: 2.47 MIL/uL — ABNORMAL LOW (ref 3.87–5.11)
RDW: 21.1 % — ABNORMAL HIGH (ref 11.5–15.5)
Smear Review: NORMAL
WBC Count: 1 10*3/uL — ABNORMAL LOW (ref 4.0–10.5)
nRBC: 0 % (ref 0.0–0.2)

## 2023-04-19 LAB — MAGNESIUM: Magnesium: 1.7 mg/dL (ref 1.7–2.4)

## 2023-04-19 LAB — CMP (CANCER CENTER ONLY)
ALT: 7 U/L (ref 0–44)
AST: 16 U/L (ref 15–41)
Albumin: 3.9 g/dL (ref 3.5–5.0)
Alkaline Phosphatase: 50 U/L (ref 38–126)
Anion gap: 7 (ref 5–15)
BUN: 21 mg/dL (ref 8–23)
CO2: 26 mmol/L (ref 22–32)
Calcium: 9.3 mg/dL (ref 8.9–10.3)
Chloride: 107 mmol/L (ref 98–111)
Creatinine: 0.85 mg/dL (ref 0.44–1.00)
GFR, Estimated: >60 mL/min (ref >60)
Glucose, Bld: 102 mg/dL — ABNORMAL HIGH (ref 70–99)
Potassium: 3.7 mmol/L (ref 3.5–5.1)
Sodium: 140 mmol/L (ref 135–145)
Total Bilirubin: 0.7 mg/dL (ref 0.3–1.2)
Total Protein: 6.5 g/dL (ref 6.5–8.1)

## 2023-04-19 NOTE — Telephone Encounter (Signed)
CRITICAL VALUE STICKER  CRITICAL VALUE:   ANC 0.4  RECEIVER (on-site recipient of call): Daneil Dolin, LPN  DATE & TIME NOTIFIED: 10:48   04/19/2023  MESSENGER (representative from lab):  Hilda Lias  MD NOTIFIED: Georga Kaufmann, PA-C  TIME OF NOTIFICATION: 10:49  RESPONSE:

## 2023-04-19 NOTE — Telephone Encounter (Signed)
Fax'd lab results from today to [facility], Dr. Lin Givens

## 2023-04-26 DIAGNOSIS — C499 Malignant neoplasm of connective and soft tissue, unspecified: Secondary | ICD-10-CM | POA: Diagnosis not present

## 2023-05-03 DIAGNOSIS — K219 Gastro-esophageal reflux disease without esophagitis: Secondary | ICD-10-CM | POA: Diagnosis not present

## 2023-05-03 DIAGNOSIS — Z886 Allergy status to analgesic agent status: Secondary | ICD-10-CM | POA: Diagnosis not present

## 2023-05-03 DIAGNOSIS — E785 Hyperlipidemia, unspecified: Secondary | ICD-10-CM | POA: Diagnosis not present

## 2023-05-03 DIAGNOSIS — Z5111 Encounter for antineoplastic chemotherapy: Secondary | ICD-10-CM | POA: Diagnosis not present

## 2023-05-03 DIAGNOSIS — E878 Other disorders of electrolyte and fluid balance, not elsewhere classified: Secondary | ICD-10-CM | POA: Diagnosis not present

## 2023-05-03 DIAGNOSIS — C7801 Secondary malignant neoplasm of right lung: Secondary | ICD-10-CM | POA: Diagnosis not present

## 2023-05-03 DIAGNOSIS — Z882 Allergy status to sulfonamides status: Secondary | ICD-10-CM | POA: Diagnosis not present

## 2023-05-03 DIAGNOSIS — I10 Essential (primary) hypertension: Secondary | ICD-10-CM | POA: Diagnosis not present

## 2023-05-03 DIAGNOSIS — C649 Malignant neoplasm of unspecified kidney, except renal pelvis: Secondary | ICD-10-CM | POA: Diagnosis not present

## 2023-05-03 DIAGNOSIS — Z95828 Presence of other vascular implants and grafts: Secondary | ICD-10-CM | POA: Diagnosis not present

## 2023-05-03 DIAGNOSIS — C499 Malignant neoplasm of connective and soft tissue, unspecified: Secondary | ICD-10-CM | POA: Diagnosis not present

## 2023-05-03 DIAGNOSIS — D61810 Antineoplastic chemotherapy induced pancytopenia: Secondary | ICD-10-CM | POA: Diagnosis not present

## 2023-05-03 DIAGNOSIS — C7802 Secondary malignant neoplasm of left lung: Secondary | ICD-10-CM | POA: Diagnosis not present

## 2023-05-07 ENCOUNTER — Other Ambulatory Visit: Payer: Self-pay | Admitting: Hematology and Oncology

## 2023-05-11 ENCOUNTER — Inpatient Hospital Stay: Payer: Medicare HMO | Attending: Physician Assistant

## 2023-05-11 VITALS — BP 118/67 | HR 109 | Temp 98.0°F

## 2023-05-11 DIAGNOSIS — Z95828 Presence of other vascular implants and grafts: Secondary | ICD-10-CM

## 2023-05-11 DIAGNOSIS — C7989 Secondary malignant neoplasm of other specified sites: Secondary | ICD-10-CM

## 2023-05-11 DIAGNOSIS — Z5189 Encounter for other specified aftercare: Secondary | ICD-10-CM | POA: Diagnosis not present

## 2023-05-11 DIAGNOSIS — C499 Malignant neoplasm of connective and soft tissue, unspecified: Secondary | ICD-10-CM | POA: Insufficient documentation

## 2023-05-11 MED ORDER — PEGFILGRASTIM-CBQV 6 MG/0.6ML ~~LOC~~ SOSY
6.0000 mg | PREFILLED_SYRINGE | Freq: Once | SUBCUTANEOUS | Status: AC
  Administered 2023-05-11: 6 mg via SUBCUTANEOUS
  Filled 2023-05-11: qty 0.6

## 2023-05-13 ENCOUNTER — Ambulatory Visit: Payer: Medicare HMO

## 2023-05-24 DIAGNOSIS — C7802 Secondary malignant neoplasm of left lung: Secondary | ICD-10-CM | POA: Diagnosis not present

## 2023-05-24 DIAGNOSIS — T451X5A Adverse effect of antineoplastic and immunosuppressive drugs, initial encounter: Secondary | ICD-10-CM | POA: Diagnosis not present

## 2023-05-24 DIAGNOSIS — D61810 Antineoplastic chemotherapy induced pancytopenia: Secondary | ICD-10-CM | POA: Diagnosis not present

## 2023-05-24 DIAGNOSIS — C499 Malignant neoplasm of connective and soft tissue, unspecified: Secondary | ICD-10-CM | POA: Diagnosis not present

## 2023-05-24 DIAGNOSIS — C498 Malignant neoplasm of overlapping sites of connective and soft tissue: Secondary | ICD-10-CM | POA: Diagnosis not present

## 2023-05-24 DIAGNOSIS — C7801 Secondary malignant neoplasm of right lung: Secondary | ICD-10-CM | POA: Diagnosis not present

## 2023-05-24 DIAGNOSIS — T451X5D Adverse effect of antineoplastic and immunosuppressive drugs, subsequent encounter: Secondary | ICD-10-CM | POA: Diagnosis not present

## 2023-05-30 DIAGNOSIS — D61810 Antineoplastic chemotherapy induced pancytopenia: Secondary | ICD-10-CM | POA: Diagnosis not present

## 2023-05-30 DIAGNOSIS — E785 Hyperlipidemia, unspecified: Secondary | ICD-10-CM | POA: Diagnosis not present

## 2023-05-30 DIAGNOSIS — Z5111 Encounter for antineoplastic chemotherapy: Secondary | ICD-10-CM | POA: Diagnosis not present

## 2023-05-30 DIAGNOSIS — C649 Malignant neoplasm of unspecified kidney, except renal pelvis: Secondary | ICD-10-CM | POA: Diagnosis not present

## 2023-05-30 DIAGNOSIS — C494 Malignant neoplasm of connective and soft tissue of abdomen: Secondary | ICD-10-CM | POA: Diagnosis not present

## 2023-05-30 DIAGNOSIS — I10 Essential (primary) hypertension: Secondary | ICD-10-CM | POA: Diagnosis not present

## 2023-05-30 DIAGNOSIS — C7801 Secondary malignant neoplasm of right lung: Secondary | ICD-10-CM | POA: Diagnosis not present

## 2023-05-30 DIAGNOSIS — C7802 Secondary malignant neoplasm of left lung: Secondary | ICD-10-CM | POA: Diagnosis not present

## 2023-05-30 DIAGNOSIS — B370 Candidal stomatitis: Secondary | ICD-10-CM | POA: Diagnosis not present

## 2023-05-30 DIAGNOSIS — K219 Gastro-esophageal reflux disease without esophagitis: Secondary | ICD-10-CM | POA: Diagnosis not present

## 2023-05-30 DIAGNOSIS — C787 Secondary malignant neoplasm of liver and intrahepatic bile duct: Secondary | ICD-10-CM | POA: Diagnosis not present

## 2023-05-30 DIAGNOSIS — C499 Malignant neoplasm of connective and soft tissue, unspecified: Secondary | ICD-10-CM | POA: Diagnosis not present

## 2023-05-30 DIAGNOSIS — R61 Generalized hyperhidrosis: Secondary | ICD-10-CM | POA: Diagnosis not present

## 2023-06-03 ENCOUNTER — Telehealth: Payer: Self-pay | Admitting: Hematology and Oncology

## 2023-06-10 ENCOUNTER — Other Ambulatory Visit: Payer: Self-pay

## 2023-06-10 ENCOUNTER — Inpatient Hospital Stay: Payer: Medicare HMO

## 2023-06-10 ENCOUNTER — Other Ambulatory Visit: Payer: Self-pay | Admitting: Hematology and Oncology

## 2023-06-10 ENCOUNTER — Inpatient Hospital Stay: Payer: Medicare HMO | Attending: Physician Assistant

## 2023-06-10 VITALS — BP 139/79 | HR 90 | Temp 99.1°F | Resp 16

## 2023-06-10 DIAGNOSIS — Z95828 Presence of other vascular implants and grafts: Secondary | ICD-10-CM

## 2023-06-10 DIAGNOSIS — C7802 Secondary malignant neoplasm of left lung: Secondary | ICD-10-CM | POA: Insufficient documentation

## 2023-06-10 DIAGNOSIS — C499 Malignant neoplasm of connective and soft tissue, unspecified: Secondary | ICD-10-CM | POA: Insufficient documentation

## 2023-06-10 DIAGNOSIS — Z5189 Encounter for other specified aftercare: Secondary | ICD-10-CM | POA: Diagnosis not present

## 2023-06-10 DIAGNOSIS — C7801 Secondary malignant neoplasm of right lung: Secondary | ICD-10-CM | POA: Diagnosis not present

## 2023-06-10 DIAGNOSIS — C7989 Secondary malignant neoplasm of other specified sites: Secondary | ICD-10-CM

## 2023-06-10 LAB — CBC WITH DIFFERENTIAL (CANCER CENTER ONLY)
Abs Immature Granulocytes: 0.02 10*3/uL (ref 0.00–0.07)
Basophils Absolute: 0 10*3/uL (ref 0.0–0.1)
Basophils Relative: 1 %
Eosinophils Absolute: 0 10*3/uL (ref 0.0–0.5)
Eosinophils Relative: 1 %
HCT: 26.2 % — ABNORMAL LOW (ref 36.0–46.0)
Hemoglobin: 8.8 g/dL — ABNORMAL LOW (ref 12.0–15.0)
Immature Granulocytes: 1 %
Lymphocytes Relative: 7 %
Lymphs Abs: 0.1 10*3/uL — ABNORMAL LOW (ref 0.7–4.0)
MCH: 34.6 pg — ABNORMAL HIGH (ref 26.0–34.0)
MCHC: 33.6 g/dL (ref 30.0–36.0)
MCV: 103.1 fL — ABNORMAL HIGH (ref 80.0–100.0)
Monocytes Absolute: 0.1 10*3/uL (ref 0.1–1.0)
Monocytes Relative: 3 %
Neutro Abs: 1.3 10*3/uL — ABNORMAL LOW (ref 1.7–7.7)
Neutrophils Relative %: 87 %
Platelet Count: 119 10*3/uL — ABNORMAL LOW (ref 150–400)
RBC: 2.54 MIL/uL — ABNORMAL LOW (ref 3.87–5.11)
RDW: 19.2 % — ABNORMAL HIGH (ref 11.5–15.5)
Smear Review: NORMAL
WBC Count: 1.6 10*3/uL — ABNORMAL LOW (ref 4.0–10.5)
nRBC: 0 % (ref 0.0–0.2)

## 2023-06-10 LAB — CMP (CANCER CENTER ONLY)
ALT: 8 U/L (ref 0–44)
AST: 19 U/L (ref 15–41)
Albumin: 3.6 g/dL (ref 3.5–5.0)
Alkaline Phosphatase: 51 U/L (ref 38–126)
Anion gap: 6 (ref 5–15)
BUN: 19 mg/dL (ref 8–23)
CO2: 24 mmol/L (ref 22–32)
Calcium: 9.4 mg/dL (ref 8.9–10.3)
Chloride: 107 mmol/L (ref 98–111)
Creatinine: 0.78 mg/dL (ref 0.44–1.00)
GFR, Estimated: >60 mL/min (ref >60)
Glucose, Bld: 89 mg/dL (ref 70–99)
Potassium: 4.6 mmol/L (ref 3.5–5.1)
Sodium: 137 mmol/L (ref 135–145)
Total Bilirubin: 0.6 mg/dL (ref 0.3–1.2)
Total Protein: 6.4 g/dL — ABNORMAL LOW (ref 6.5–8.1)

## 2023-06-10 LAB — MAGNESIUM: Magnesium: 1.9 mg/dL (ref 1.7–2.4)

## 2023-06-10 MED ORDER — PEGFILGRASTIM-CBQV 6 MG/0.6ML ~~LOC~~ SOSY
6.0000 mg | PREFILLED_SYRINGE | Freq: Once | SUBCUTANEOUS | Status: AC
  Administered 2023-06-10: 6 mg via SUBCUTANEOUS
  Filled 2023-06-10: qty 0.6

## 2023-06-10 NOTE — Patient Instructions (Signed)

## 2023-06-17 ENCOUNTER — Other Ambulatory Visit: Payer: Self-pay | Admitting: *Deleted

## 2023-06-17 ENCOUNTER — Inpatient Hospital Stay: Payer: Medicare HMO

## 2023-06-17 ENCOUNTER — Other Ambulatory Visit: Payer: Self-pay

## 2023-06-17 ENCOUNTER — Telehealth: Payer: Self-pay | Admitting: *Deleted

## 2023-06-17 DIAGNOSIS — C499 Malignant neoplasm of connective and soft tissue, unspecified: Secondary | ICD-10-CM | POA: Diagnosis not present

## 2023-06-17 DIAGNOSIS — C7801 Secondary malignant neoplasm of right lung: Secondary | ICD-10-CM | POA: Diagnosis not present

## 2023-06-17 DIAGNOSIS — Z5189 Encounter for other specified aftercare: Secondary | ICD-10-CM | POA: Diagnosis not present

## 2023-06-17 DIAGNOSIS — C7802 Secondary malignant neoplasm of left lung: Secondary | ICD-10-CM | POA: Diagnosis not present

## 2023-06-17 LAB — CBC WITH DIFFERENTIAL (CANCER CENTER ONLY)
Abs Immature Granulocytes: 0.09 10*3/uL — ABNORMAL HIGH (ref 0.00–0.07)
Basophils Absolute: 0 10*3/uL (ref 0.0–0.1)
Basophils Relative: 0 %
Eosinophils Absolute: 0 10*3/uL (ref 0.0–0.5)
Eosinophils Relative: 0 %
HCT: 24.1 % — ABNORMAL LOW (ref 36.0–46.0)
Hemoglobin: 8.1 g/dL — ABNORMAL LOW (ref 12.0–15.0)
Immature Granulocytes: 2 %
Lymphocytes Relative: 3 %
Lymphs Abs: 0.2 10*3/uL — ABNORMAL LOW (ref 0.7–4.0)
MCH: 34.2 pg — ABNORMAL HIGH (ref 26.0–34.0)
MCHC: 33.6 g/dL (ref 30.0–36.0)
MCV: 101.7 fL — ABNORMAL HIGH (ref 80.0–100.0)
Monocytes Absolute: 1.2 10*3/uL — ABNORMAL HIGH (ref 0.1–1.0)
Monocytes Relative: 21 %
Neutro Abs: 4.5 10*3/uL (ref 1.7–7.7)
Neutrophils Relative %: 74 %
Platelet Count: 31 10*3/uL — ABNORMAL LOW (ref 150–400)
RBC: 2.37 MIL/uL — ABNORMAL LOW (ref 3.87–5.11)
RDW: 19.1 % — ABNORMAL HIGH (ref 11.5–15.5)
Smear Review: DECREASED
WBC Count: 6.1 10*3/uL (ref 4.0–10.5)
nRBC: 0.3 % — ABNORMAL HIGH (ref 0.0–0.2)

## 2023-06-17 LAB — MAGNESIUM: Magnesium: 1.8 mg/dL (ref 1.7–2.4)

## 2023-06-17 LAB — CMP (CANCER CENTER ONLY)
ALT: 6 U/L (ref 0–44)
AST: 16 U/L (ref 15–41)
Albumin: 3.8 g/dL (ref 3.5–5.0)
Alkaline Phosphatase: 61 U/L (ref 38–126)
Anion gap: 7 (ref 5–15)
BUN: 15 mg/dL (ref 8–23)
CO2: 25 mmol/L (ref 22–32)
Calcium: 9.6 mg/dL (ref 8.9–10.3)
Chloride: 106 mmol/L (ref 98–111)
Creatinine: 0.84 mg/dL (ref 0.44–1.00)
GFR, Estimated: >60 mL/min (ref >60)
Glucose, Bld: 85 mg/dL (ref 70–99)
Potassium: 4.1 mmol/L (ref 3.5–5.1)
Sodium: 138 mmol/L (ref 135–145)
Total Bilirubin: 0.4 mg/dL (ref 0.3–1.2)
Total Protein: 6.6 g/dL (ref 6.5–8.1)

## 2023-06-17 LAB — PREPARE RBC (CROSSMATCH)

## 2023-06-17 LAB — SAMPLE TO BLOOD BANK

## 2023-06-17 LAB — ABO/RH: ABO/RH(D): A POS

## 2023-06-17 MED ORDER — SODIUM CHLORIDE 0.9% FLUSH: 10.0000 mL | Freq: Once | INTRAVENOUS | Status: DC

## 2023-06-17 NOTE — Telephone Encounter (Signed)
Received cbc results from today. Pt's  HGB is 8.1 She is to get 1 unit of blood per [facility] Heme/Onc orders for HGB  < 9. Spoke with pt in the lobby. Advised that she will need 1 unit of blood. She states she is feeling very fatigued and is agreeable to do this on Wednesday, 06/19/23. ABO/RH will be obtained today as she has not had transfusions here in the past. Advised to keep blue bracelet on for transfusion on Wednesday. Pt voiced understanding.  Transfusion orders have been placed.

## 2023-06-19 ENCOUNTER — Inpatient Hospital Stay: Payer: Medicare HMO

## 2023-06-19 ENCOUNTER — Emergency Department (HOSPITAL_COMMUNITY)
Admission: EM | Admit: 2023-06-19 | Discharge: 2023-06-20 | Disposition: A | Discharge status: Home / Self Care | Payer: Medicare HMO | Attending: Emergency Medicine | Admitting: Emergency Medicine

## 2023-06-19 ENCOUNTER — Other Ambulatory Visit: Payer: Self-pay

## 2023-06-19 ENCOUNTER — Emergency Department (HOSPITAL_COMMUNITY): Payer: Medicare HMO

## 2023-06-19 DIAGNOSIS — Z20822 Contact with and (suspected) exposure to covid-19: Secondary | ICD-10-CM | POA: Diagnosis not present

## 2023-06-19 DIAGNOSIS — R509 Fever, unspecified: Secondary | ICD-10-CM | POA: Insufficient documentation

## 2023-06-19 DIAGNOSIS — Z4682 Encounter for fitting and adjustment of non-vascular catheter: Secondary | ICD-10-CM | POA: Diagnosis not present

## 2023-06-19 DIAGNOSIS — C499 Malignant neoplasm of connective and soft tissue, unspecified: Secondary | ICD-10-CM | POA: Diagnosis not present

## 2023-06-19 DIAGNOSIS — R791 Abnormal coagulation profile: Secondary | ICD-10-CM | POA: Insufficient documentation

## 2023-06-19 DIAGNOSIS — R5383 Other fatigue: Secondary | ICD-10-CM | POA: Insufficient documentation

## 2023-06-19 LAB — CBC WITH DIFFERENTIAL/PLATELET
Abs Immature Granulocytes: 0.1 10*3/uL — ABNORMAL HIGH (ref 0.00–0.07)
Basophils Absolute: 0 10*3/uL (ref 0.0–0.1)
Basophils Relative: 0 %
Eosinophils Absolute: 0 10*3/uL (ref 0.0–0.5)
Eosinophils Relative: 0 %
HCT: 27.2 % — ABNORMAL LOW (ref 36.0–46.0)
Hemoglobin: 9.4 g/dL — ABNORMAL LOW (ref 12.0–15.0)
Immature Granulocytes: 2 %
Lymphocytes Relative: 3 %
Lymphs Abs: 0.1 10*3/uL — ABNORMAL LOW (ref 0.7–4.0)
MCH: 33.7 pg (ref 26.0–34.0)
MCHC: 34.6 g/dL (ref 30.0–36.0)
MCV: 97.5 fL (ref 80.0–100.0)
Monocytes Absolute: 0.9 10*3/uL (ref 0.1–1.0)
Monocytes Relative: 19 %
Neutro Abs: 3.6 10*3/uL (ref 1.7–7.7)
Neutrophils Relative %: 76 %
Platelets: 29 10*3/uL — CL (ref 150–400)
RBC: 2.79 MIL/uL — ABNORMAL LOW (ref 3.87–5.11)
RDW: 21.7 % — ABNORMAL HIGH (ref 11.5–15.5)
WBC: 4.8 10*3/uL (ref 4.0–10.5)
nRBC: 0.8 % — ABNORMAL HIGH (ref 0.0–0.2)

## 2023-06-19 LAB — URINALYSIS, W/ REFLEX TO CULTURE (INFECTION SUSPECTED)
Bacteria, UA: NONE SEEN
Bilirubin Urine: NEGATIVE
Glucose, UA: NEGATIVE mg/dL
Hgb urine dipstick: NEGATIVE
Ketones, ur: 20 mg/dL — AB
Leukocytes,Ua: NEGATIVE
Nitrite: NEGATIVE
Protein, ur: 30 mg/dL — AB
Specific Gravity, Urine: 1.025 (ref 1.005–1.030)
pH: 6 (ref 5.0–8.0)

## 2023-06-19 LAB — COMPREHENSIVE METABOLIC PANEL
ALT: 13 U/L (ref 0–44)
AST: 23 U/L (ref 15–41)
Albumin: 3.4 g/dL — ABNORMAL LOW (ref 3.5–5.0)
Alkaline Phosphatase: 55 U/L (ref 38–126)
Anion gap: 7 (ref 5–15)
BUN: 19 mg/dL (ref 8–23)
CO2: 20 mmol/L — ABNORMAL LOW (ref 22–32)
Calcium: 8.6 mg/dL — ABNORMAL LOW (ref 8.9–10.3)
Chloride: 105 mmol/L (ref 98–111)
Creatinine, Ser: 0.92 mg/dL (ref 0.44–1.00)
GFR, Estimated: >60 mL/min (ref >60)
Glucose, Bld: 101 mg/dL — ABNORMAL HIGH (ref 70–99)
Potassium: 3.8 mmol/L (ref 3.5–5.1)
Sodium: 132 mmol/L — ABNORMAL LOW (ref 135–145)
Total Bilirubin: 0.9 mg/dL (ref 0.3–1.2)
Total Protein: 6.5 g/dL (ref 6.5–8.1)

## 2023-06-19 LAB — LACTIC ACID, PLASMA: Lactic Acid, Venous: 1 mmol/L (ref 0.5–1.9)

## 2023-06-19 LAB — PROTIME-INR
INR: 1.1 (ref 0.8–1.2)
Prothrombin Time: 14.8 seconds (ref 11.4–15.2)

## 2023-06-19 LAB — APTT: aPTT: 28 seconds (ref 24–36)

## 2023-06-19 MED ORDER — SODIUM CHLORIDE 0.9% IV SOLUTION
250.0000 mL | Freq: Once | INTRAVENOUS | Status: AC
  Administered 2023-06-19: 250 mL via INTRAVENOUS

## 2023-06-19 MED ORDER — LACTATED RINGERS IV BOLUS (SEPSIS)
1000.0000 mL | Freq: Once | INTRAVENOUS | Status: AC
  Administered 2023-06-19: 1000 mL via INTRAVENOUS

## 2023-06-19 MED ORDER — LACTATED RINGERS IV BOLUS (SEPSIS): 1000.0000 mL | Freq: Once | INTRAVENOUS | Status: DC

## 2023-06-19 MED ORDER — VANCOMYCIN HCL IN DEXTROSE 1-5 GM/200ML-% IV SOLN: 1000.0000 mg | Freq: Once | INTRAVENOUS | Status: DC

## 2023-06-19 MED ORDER — LACTATED RINGERS IV SOLN: INTRAVENOUS | Status: DC

## 2023-06-19 MED ORDER — LACTATED RINGERS IV BOLUS (SEPSIS): 250.0000 mL | Freq: Once | INTRAVENOUS | Status: DC

## 2023-06-19 MED ORDER — SODIUM CHLORIDE 0.9 % IV SOLN
2.0000 g | Freq: Once | INTRAVENOUS | Status: AC
  Administered 2023-06-19: 2 g via INTRAVENOUS
  Filled 2023-06-19: qty 12.5

## 2023-06-19 MED ORDER — ACETAMINOPHEN 325 MG PO TABS
650.0000 mg | ORAL_TABLET | Freq: Once | ORAL | Status: AC
  Administered 2023-06-19: 650 mg via ORAL
  Filled 2023-06-19: qty 2

## 2023-06-19 MED ORDER — VANCOMYCIN HCL 1500 MG/300ML IV SOLN
1500.0000 mg | Freq: Once | INTRAVENOUS | Status: AC
  Administered 2023-06-19: 1500 mg via INTRAVENOUS
  Filled 2023-06-19: qty 300

## 2023-06-19 MED ORDER — METRONIDAZOLE 500 MG/100ML IV SOLN
500.0000 mg | Freq: Once | INTRAVENOUS | Status: AC
  Administered 2023-06-19: 500 mg via INTRAVENOUS
  Filled 2023-06-19: qty 100

## 2023-06-19 MED ORDER — SODIUM CHLORIDE 0.9% FLUSH: 10.0000 mL | INTRAVENOUS | Status: DC | PRN

## 2023-06-19 NOTE — ED Triage Notes (Signed)
Pt reports fever of 103 at home prior to arrival. Pt was given 1 unit of blood today that finished at 11am at oncology clinic due to low hgb. Pt is currently taking chemo. Pt reports she was feeling fatigued prior to transfusion but did not have fever until this afternoon.

## 2023-06-19 NOTE — Sepsis Progress Note (Addendum)
Elink monitoring for the code sepsis protocol.  Secure chat sent to MD requesting order for lactics.

## 2023-06-19 NOTE — ED Provider Notes (Signed)
[place] EMERGENCY DEPARTMENT AT [facility] Provider Note   CSN: [id number] Arrival date & time: 06/19/23  1858     History  Chief Complaint  Patient presents with   Fever   Fatigue    Tonya Palmer is a 72 y.o. female.  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 States that she has been feeling fatigued for the past few days, had a blood transfusion at 8 am that ran until 11 am this morning. She then went home and tried to rest but felt warm and checked her temperature and noted that it was 103. She called her oncology team at [facility] and was told to come here for evaluation given that she got her transfusion here and lives in [place].  She states that when she receives chemotherapy she is admitted to the hospital for 7 days and receives continuous chemotherapy.  This last happened at the first of the month.  She has not had complications from this.  Denies any cough, congestion, chest pain, shortness of breath, nausea, vomiting, abdominal pain, or urinary symptoms.  Denies any wounds.  The history is provided by the patient. No language interpreter was used.  Fever      Home Medications Prior to Admission medications   Medication Sig Start Date End Date Taking? Authorizing Provider  atorvastatin (LIPITOR) 10 MG tablet Take 10 mg by mouth daily.   Yes [provider]  Multiple Vitamin (MULTIVITAMIN WITH MINERALS) TABS tablet Take 1 tablet by mouth 3 (three) times a week.   Yes [provider]  nystatin (MYCOSTATIN) 100000 UNIT/ML suspension Use as directed 5 mLs in the mouth or throat 4 (four) times daily as needed (mouth pain). 06/07/23  Yes [provider]  omeprazole (PRILOSEC) 40 MG capsule Take 1 capsule by mouth daily. 01/08/23  Yes [provider]  ondansetron (ZOFRAN) 8 MG tablet Take 8 mg by mouth every 8 (eight) hours as needed for  nausea or vomiting. 01/04/23  Yes [provider]  prochlorperazine (COMPAZINE) 5 MG tablet Take 5 mg by mouth every 6 (six) hours as needed for nausea or vomiting. 01/04/23  Yes [provider]      Allergies    Lisinopril and Sulfamethoxazole-trimethoprim    Review of Systems   Review of Systems  Constitutional:  Positive for fever.  All other systems reviewed and are negative.   Physical Exam Updated Vital Signs BP 120/64   Pulse 90   Temp 98.3 F (36.8 C) (Oral)   Resp 18   Ht 5\' 5"  (1.651 m)   Wt 68 kg   SpO2 94%   BMI 24.96 kg/m  Physical Exam Vitals and nursing note reviewed.  Constitutional:      General: She is not in acute distress.    Appearance: Normal appearance. She is normal weight. She is not ill-appearing, toxic-appearing or diaphoretic.  HENT:     Head: Normocephalic and atraumatic.  Eyes:     Extraocular Movements: Extraocular movements intact.     Pupils: Pupils are equal, round, and reactive to light.  Cardiovascular:     Rate and Rhythm: Normal rate and regular rhythm.     Heart sounds: Normal heart sounds.  Pulmonary:     Effort: Pulmonary effort is normal. No respiratory distress.     Breath sounds: Normal breath sounds.  Chest:     Comments: Port in chest non-infectious appearing Abdominal:  General: Abdomen is flat.     Palpations: Abdomen is soft.     Tenderness: There is no abdominal tenderness.  Musculoskeletal:        General: Normal range of motion.     Cervical back: Normal range of motion and neck supple.     Right lower leg: No edema.     Left lower leg: No edema.  Skin:    General: Skin is warm and dry.     Findings: No lesion.  Neurological:     General: No focal deficit present.     Mental Status: She is alert.  Psychiatric:        Mood and Affect: Mood normal.        Behavior: Behavior normal.    ED Results / Procedures / Treatments   Labs (all labs ordered are listed, but only abnormal results are  displayed) Labs Reviewed  COMPREHENSIVE METABOLIC PANEL - Abnormal; Notable for the following components:      Result Value   Sodium 132 (*)    CO2 20 (*)    Glucose, Bld 101 (*)    Calcium 8.6 (*)    Albumin 3.4 (*)    All other components within normal limits  CBC WITH DIFFERENTIAL/PLATELET - Abnormal; Notable for the following components:   RBC 2.79 (*)    Hemoglobin 9.4 (*)    HCT 27.2 (*)    RDW 21.7 (*)    Platelets 29 (*)    nRBC 0.8 (*)    Lymphs Abs 0.1 (*)    Abs Immature Granulocytes 0.10 (*)    All other components within normal limits  URINALYSIS, W/ REFLEX TO CULTURE (INFECTION SUSPECTED) - Abnormal; Notable for the following components:   Ketones, ur 20 (*)    Protein, ur 30 (*)    All other components within normal limits  RESP PANEL BY RT-PCR (RSV, FLU A&B, COVID)  RVPGX2  CULTURE, BLOOD (ROUTINE X 2)  CULTURE, BLOOD (ROUTINE X 2)  URINE CULTURE  PROTIME-INR  APTT  LACTIC ACID, PLASMA  LACTIC ACID, PLASMA    EKG None  Radiology DG Chest Port 1 View  Result Date: 06/19/2023 CLINICAL DATA:  Concern for sepsis, fever EXAM: PORTABLE CHEST 1 VIEW COMPARISON:  11/06/2022 FINDINGS: Interval placement of right chest port with catheter tip near the superior cavoatrial junction. Normal cardiac and mediastinal contours. No new focal pulmonary opacity. Several of the patient's previously noted pulmonary nodules are seen, but not all are apparent. No pleural effusion or pneumothorax. No acute osseous abnormality. IMPRESSION: Interval placement of right chest port with catheter tip near the superior cavoatrial junction. No new focal pulmonary opacity. Electronically Signed   By: Wiliam Ke M.D.   On: 06/19/2023 19:50    Procedures Procedures    Medications Ordered in ED Medications  lactated ringers infusion (has no administration in time range)  vancomycin (VANCOREADY) IVPB 1500 mg/300 mL (1,500 mg Intravenous New Bag/Given 06/19/23 2235)  lactated ringers  bolus 1,000 mL (0 mLs Intravenous Stopped 06/19/23 2213)  ceFEPIme (MAXIPIME) 2 g in sodium chloride 0.9 % 100 mL IVPB (0 g Intravenous Stopped 06/19/23 2053)  metroNIDAZOLE (FLAGYL) IVPB 500 mg (0 mg Intravenous Stopped 06/19/23 2213)  acetaminophen (TYLENOL) tablet 650 mg (650 mg Oral Given 06/19/23 2046)    ED Course/ Medical Decision Making/ A&P  Medical Decision Making Amount and/or Complexity of Data Reviewed Labs: ordered. Radiology: ordered. ECG/medicine tests: ordered.  Risk OTC drugs. Prescription drug management.   This patient is a 72 y.o. female who presents to the ED for concern of fever, fatigue, this involves an extensive number of treatment options, and is a complaint that carries with it a high risk of complications and morbidity. The emergent differential diagnosis prior to evaluation includes, but is not limited to,  sepsis, neutropenic fever, URI, UTI, pneumonia. This is not an exhaustive differential.   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 Pertinent results include: patient last received chemo through a port a few weeks ago. Also had a blood transfusion for symptomatic anemia this morning   Physical Exam: Physical exam performed. The pertinent findings include: per above, no pertinent physical exam findings. Non-infectious port present on the right chest.  Lab Tests: I ordered, and personally interpreted labs.  The pertinent results include:  WBC 4.8, hgb 9.4 up from 8.1 3 days ago, platelets 29. Lactic WNL, Na 132, bicarb 20. UA noninfectious. Blood cultures and urine cultures pending   Imaging Studies: I ordered imaging studies including CXR. I independently visualized and interpreted imaging which showed   Interval placement of right chest port with catheter tip near the superior cavoatrial junction. No new focal  pulmonary opacity.   I agree with the radiologist interpretation.   Medications: I ordered medication including fluids, vanc, cefepime, flagyl  for fever, tachycardia, actively on chemotherapy, concern for sepsis/neutropenic fever. Reevaluation of the patient after these medicines showed that the patient improved. I have reviewed the patients home medicines and have made adjustments as needed.  Disposition: After consideration of the diagnostic results and the patients response to treatment, I feel that emergency department workup does not suggest an emergent condition requiring admission or immediate intervention beyond what has been performed at this time. The plan is: discharge with close outpatient follow-up and close return precautions. Initially there was concern for sepsis given her fever with tachycardia, however no source on infection identified and no leukocytosis present with no lactic acidosis. She is not neutropenic. No indication for admission. Blood cultures and urine culture pending. Evaluation and diagnostic testing in the emergency department does not suggest an emergent condition requiring admission or immediate intervention beyond what has been performed at this time.  Plan for discharge with close PCP and oncology follow-up.  Patient is understanding and amenable with plan, educated on red flag symptoms that would prompt immediate return.  Patient discharged in stable condition.   This is a shared visit with supervising physician Dr. Lynelle Doctor who has independently evaluated patient & provided guidance in evaluation/management/disposition, in agreement with care    Final Clinical Impression(s) / ED Diagnoses Final diagnoses:  Fever, unspecified fever cause    Rx / DC Orders ED Discharge Orders     None     An After Visit Summary was printed and given to the patient.     Vear Clock 06/21/23 1622    Linwood Dibbles, MD 06/24/23 [phone]

## 2023-06-19 NOTE — Progress Notes (Signed)
A consult was received from an ED physician for Vancomycin and Cefepime per pharmacy dosing.  The patient's profile has been reviewed for ht/wt/allergies/indication/available labs.     A one time order has been placed for: - Cefepime 2gm IV once x 1 dose - Vancomycin 1.5gm IV once x 1 dose  Further antibiotics/pharmacy consults should be ordered by admitting physician if indicated.                       Thank you, Josefa Half 06/19/2023  7:39 PM

## 2023-06-19 NOTE — Discharge Instructions (Signed)
As we discussed, your workup in the ER today was reassuring for acute findings.  Laboratory evaluation and imaging did not reveal a source of infection.  We have given you a round of IV antibiotics today.  I recommend that you monitor your symptoms closely over the next few days and follow-up closely with your oncology team.  If you develop any signs of an infection you need to return for additional antibiotics.  You also have a urine culture and blood cultures that are pending and you will get a phone call if they grow any source of infection.  Return if development of any new or worsening symptoms.

## 2023-06-20 LAB — TYPE AND SCREEN
ABO/RH(D): A POS
Antibody Screen: NEGATIVE
Unit division: 0

## 2023-06-20 LAB — BPAM RBC
Blood Product Expiration Date: 202408092359
ISSUE DATE / TIME: 202407170858
Unit Type and Rh: 6200

## 2023-06-20 LAB — RESP PANEL BY RT-PCR (RSV, FLU A&B, COVID)  RVPGX2
Influenza A by PCR: NEGATIVE
Influenza B by PCR: NEGATIVE
Resp Syncytial Virus by PCR: NEGATIVE
SARS Coronavirus 2 by RT PCR: NEGATIVE

## 2023-06-21 ENCOUNTER — Inpatient Hospital Stay: Payer: Medicare HMO

## 2023-06-21 DIAGNOSIS — D61810 Antineoplastic chemotherapy induced pancytopenia: Secondary | ICD-10-CM | POA: Diagnosis not present

## 2023-06-21 DIAGNOSIS — B957 Other staphylococcus as the cause of diseases classified elsewhere: Secondary | ICD-10-CM | POA: Diagnosis not present

## 2023-06-21 DIAGNOSIS — C762 Malignant neoplasm of abdomen: Secondary | ICD-10-CM | POA: Diagnosis not present

## 2023-06-21 DIAGNOSIS — C7802 Secondary malignant neoplasm of left lung: Secondary | ICD-10-CM | POA: Diagnosis not present

## 2023-06-21 DIAGNOSIS — R509 Fever, unspecified: Secondary | ICD-10-CM | POA: Diagnosis not present

## 2023-06-21 DIAGNOSIS — C642 Malignant neoplasm of left kidney, except renal pelvis: Secondary | ICD-10-CM | POA: Diagnosis not present

## 2023-06-21 DIAGNOSIS — E785 Hyperlipidemia, unspecified: Secondary | ICD-10-CM | POA: Diagnosis not present

## 2023-06-21 DIAGNOSIS — I10 Essential (primary) hypertension: Secondary | ICD-10-CM | POA: Diagnosis not present

## 2023-06-21 DIAGNOSIS — C787 Secondary malignant neoplasm of liver and intrahepatic bile duct: Secondary | ICD-10-CM | POA: Diagnosis not present

## 2023-06-21 DIAGNOSIS — K7689 Other specified diseases of liver: Secondary | ICD-10-CM | POA: Diagnosis not present

## 2023-06-21 DIAGNOSIS — B958 Unspecified staphylococcus as the cause of diseases classified elsewhere: Secondary | ICD-10-CM | POA: Diagnosis not present

## 2023-06-21 DIAGNOSIS — R918 Other nonspecific abnormal finding of lung field: Secondary | ICD-10-CM | POA: Diagnosis not present

## 2023-06-21 DIAGNOSIS — K219 Gastro-esophageal reflux disease without esophagitis: Secondary | ICD-10-CM | POA: Diagnosis not present

## 2023-06-21 DIAGNOSIS — C499 Malignant neoplasm of connective and soft tissue, unspecified: Secondary | ICD-10-CM | POA: Diagnosis not present

## 2023-06-21 DIAGNOSIS — R7881 Bacteremia: Secondary | ICD-10-CM | POA: Diagnosis not present

## 2023-06-21 DIAGNOSIS — C7801 Secondary malignant neoplasm of right lung: Secondary | ICD-10-CM | POA: Diagnosis not present

## 2023-06-21 DIAGNOSIS — D6959 Other secondary thrombocytopenia: Secondary | ICD-10-CM | POA: Diagnosis not present

## 2023-06-21 LAB — BLOOD CULTURE ID PANEL (REFLEXED) - BCID2

## 2023-06-21 LAB — URINE CULTURE: Culture: <10000 — AB

## 2023-06-21 LAB — CULTURE, BLOOD (ROUTINE X 2)

## 2023-06-22 LAB — CULTURE, BLOOD (ROUTINE X 2): Culture: NO GROWTH

## 2023-06-24 ENCOUNTER — Telehealth: Payer: Self-pay | Admitting: *Deleted

## 2023-06-24 ENCOUNTER — Inpatient Hospital Stay: Payer: Medicare HMO

## 2023-06-24 ENCOUNTER — Telehealth (HOSPITAL_BASED_OUTPATIENT_CLINIC_OR_DEPARTMENT_OTHER): Payer: Self-pay

## 2023-06-24 LAB — CULTURE, BLOOD (ROUTINE X 2)

## 2023-06-24 NOTE — Telephone Encounter (Signed)
Received on call service message from pt's spouse, Maisie Fus. He states his wife has been admitted to [facility]  for sepsis.  She had a lab appt today but it has been cancelled due to admission. He is hoping she will go home soon but will be re-admitted on Friday for in patient chemo. No lab appts needed for this week here.

## 2023-06-28 ENCOUNTER — Inpatient Hospital Stay: Payer: Medicare HMO

## 2023-07-05 ENCOUNTER — Inpatient Hospital Stay: Payer: Medicare HMO

## 2023-07-05 DIAGNOSIS — N2889 Other specified disorders of kidney and ureter: Secondary | ICD-10-CM | POA: Diagnosis not present

## 2023-07-05 DIAGNOSIS — C499 Malignant neoplasm of connective and soft tissue, unspecified: Secondary | ICD-10-CM | POA: Diagnosis not present

## 2023-07-05 DIAGNOSIS — D649 Anemia, unspecified: Secondary | ICD-10-CM | POA: Diagnosis not present

## 2023-07-05 DIAGNOSIS — C7802 Secondary malignant neoplasm of left lung: Secondary | ICD-10-CM | POA: Diagnosis not present

## 2023-07-05 DIAGNOSIS — Z5111 Encounter for antineoplastic chemotherapy: Secondary | ICD-10-CM | POA: Diagnosis not present

## 2023-07-05 DIAGNOSIS — C7801 Secondary malignant neoplasm of right lung: Secondary | ICD-10-CM | POA: Diagnosis not present

## 2023-07-09 DIAGNOSIS — C7802 Secondary malignant neoplasm of left lung: Secondary | ICD-10-CM | POA: Diagnosis not present

## 2023-07-09 DIAGNOSIS — C7801 Secondary malignant neoplasm of right lung: Secondary | ICD-10-CM | POA: Diagnosis not present

## 2023-07-09 DIAGNOSIS — C499 Malignant neoplasm of connective and soft tissue, unspecified: Secondary | ICD-10-CM | POA: Diagnosis not present

## 2023-07-12 ENCOUNTER — Inpatient Hospital Stay: Payer: Medicare HMO

## 2023-07-12 DIAGNOSIS — R509 Fever, unspecified: Secondary | ICD-10-CM | POA: Diagnosis not present

## 2023-07-12 DIAGNOSIS — C7989 Secondary malignant neoplasm of other specified sites: Secondary | ICD-10-CM | POA: Diagnosis not present

## 2023-07-12 DIAGNOSIS — C7802 Secondary malignant neoplasm of left lung: Secondary | ICD-10-CM | POA: Diagnosis not present

## 2023-07-12 DIAGNOSIS — Z9221 Personal history of antineoplastic chemotherapy: Secondary | ICD-10-CM | POA: Diagnosis not present

## 2023-07-12 DIAGNOSIS — C499 Malignant neoplasm of connective and soft tissue, unspecified: Secondary | ICD-10-CM | POA: Diagnosis not present

## 2023-07-12 DIAGNOSIS — N2889 Other specified disorders of kidney and ureter: Secondary | ICD-10-CM | POA: Diagnosis not present

## 2023-07-12 DIAGNOSIS — C7801 Secondary malignant neoplasm of right lung: Secondary | ICD-10-CM | POA: Diagnosis not present

## 2023-07-12 DIAGNOSIS — C7800 Secondary malignant neoplasm of unspecified lung: Secondary | ICD-10-CM | POA: Diagnosis not present

## 2023-07-12 DIAGNOSIS — C480 Malignant neoplasm of retroperitoneum: Secondary | ICD-10-CM | POA: Diagnosis not present

## 2023-07-18 DIAGNOSIS — R509 Fever, unspecified: Secondary | ICD-10-CM | POA: Diagnosis not present

## 2023-08-28 DIAGNOSIS — C499 Malignant neoplasm of connective and soft tissue, unspecified: Secondary | ICD-10-CM | POA: Diagnosis not present

## 2023-08-30 DIAGNOSIS — C7802 Secondary malignant neoplasm of left lung: Secondary | ICD-10-CM | POA: Diagnosis not present

## 2023-08-30 DIAGNOSIS — C499 Malignant neoplasm of connective and soft tissue, unspecified: Secondary | ICD-10-CM | POA: Diagnosis not present

## 2023-08-30 DIAGNOSIS — Z23 Encounter for immunization: Secondary | ICD-10-CM | POA: Diagnosis not present

## 2023-08-30 DIAGNOSIS — N2889 Other specified disorders of kidney and ureter: Secondary | ICD-10-CM | POA: Diagnosis not present

## 2023-08-30 DIAGNOSIS — D630 Anemia in neoplastic disease: Secondary | ICD-10-CM | POA: Diagnosis not present

## 2023-08-30 DIAGNOSIS — C7801 Secondary malignant neoplasm of right lung: Secondary | ICD-10-CM | POA: Diagnosis not present

## 2023-10-25 DIAGNOSIS — I10 Essential (primary) hypertension: Secondary | ICD-10-CM | POA: Diagnosis not present

## 2023-10-25 DIAGNOSIS — C499 Malignant neoplasm of connective and soft tissue, unspecified: Secondary | ICD-10-CM | POA: Diagnosis not present

## 2023-10-25 DIAGNOSIS — E7800 Pure hypercholesterolemia, unspecified: Secondary | ICD-10-CM | POA: Diagnosis not present

## 2023-10-25 DIAGNOSIS — Z0000 Encounter for general adult medical examination without abnormal findings: Secondary | ICD-10-CM | POA: Diagnosis not present

## 2023-10-25 DIAGNOSIS — Z1331 Encounter for screening for depression: Secondary | ICD-10-CM | POA: Diagnosis not present

## 2023-10-25 DIAGNOSIS — N1831 Chronic kidney disease, stage 3a: Secondary | ICD-10-CM | POA: Diagnosis not present

## 2023-10-25 DIAGNOSIS — I700 Atherosclerosis of aorta: Secondary | ICD-10-CM | POA: Diagnosis not present

## 2023-10-30 ENCOUNTER — Other Ambulatory Visit: Payer: Self-pay | Admitting: Internal Medicine

## 2023-10-30 DIAGNOSIS — Z1231 Encounter for screening mammogram for malignant neoplasm of breast: Secondary | ICD-10-CM

## 2023-11-04 DIAGNOSIS — C7800 Secondary malignant neoplasm of unspecified lung: Secondary | ICD-10-CM | POA: Diagnosis not present

## 2023-11-04 DIAGNOSIS — C499 Malignant neoplasm of connective and soft tissue, unspecified: Secondary | ICD-10-CM | POA: Diagnosis not present

## 2023-11-06 DIAGNOSIS — C499 Malignant neoplasm of connective and soft tissue, unspecified: Secondary | ICD-10-CM | POA: Diagnosis not present

## 2023-11-13 ENCOUNTER — Encounter: Payer: Self-pay | Admitting: Hematology and Oncology

## 2023-11-15 ENCOUNTER — Other Ambulatory Visit: Payer: Self-pay | Admitting: Internal Medicine

## 2023-11-15 ENCOUNTER — Ambulatory Visit
Admission: RE | Admit: 2023-11-15 | Discharge: 2023-11-15 | Disposition: A | Discharge status: Home / Self Care | Payer: Medicare HMO | Source: Ambulatory Visit | Attending: Internal Medicine | Admitting: Internal Medicine

## 2023-11-15 DIAGNOSIS — Z1231 Encounter for screening mammogram for malignant neoplasm of breast: Secondary | ICD-10-CM

## 2023-11-20 DIAGNOSIS — L989 Disorder of the skin and subcutaneous tissue, unspecified: Secondary | ICD-10-CM | POA: Diagnosis not present

## 2023-11-20 DIAGNOSIS — I10 Essential (primary) hypertension: Secondary | ICD-10-CM | POA: Diagnosis not present

## 2023-12-18 ENCOUNTER — Encounter: Payer: Self-pay | Admitting: Hematology and Oncology

## 2023-12-23 DIAGNOSIS — D225 Melanocytic nevi of trunk: Secondary | ICD-10-CM | POA: Diagnosis not present

## 2023-12-23 DIAGNOSIS — C44519 Basal cell carcinoma of skin of other part of trunk: Secondary | ICD-10-CM | POA: Diagnosis not present

## 2023-12-23 DIAGNOSIS — D492 Neoplasm of unspecified behavior of bone, soft tissue, and skin: Secondary | ICD-10-CM | POA: Diagnosis not present

## 2024-01-01 DIAGNOSIS — C44519 Basal cell carcinoma of skin of other part of trunk: Secondary | ICD-10-CM | POA: Diagnosis not present

## 2024-01-06 DIAGNOSIS — N281 Cyst of kidney, acquired: Secondary | ICD-10-CM | POA: Diagnosis not present

## 2024-01-06 DIAGNOSIS — C499 Malignant neoplasm of connective and soft tissue, unspecified: Secondary | ICD-10-CM | POA: Diagnosis not present

## 2024-01-08 DIAGNOSIS — C499 Malignant neoplasm of connective and soft tissue, unspecified: Secondary | ICD-10-CM | POA: Diagnosis not present

## 2024-01-20 DIAGNOSIS — B351 Tinea unguium: Secondary | ICD-10-CM | POA: Diagnosis not present

## 2024-01-20 DIAGNOSIS — Z85828 Personal history of other malignant neoplasm of skin: Secondary | ICD-10-CM | POA: Diagnosis not present

## 2024-01-20 DIAGNOSIS — L814 Other melanin hyperpigmentation: Secondary | ICD-10-CM | POA: Diagnosis not present

## 2024-01-20 DIAGNOSIS — D229 Melanocytic nevi, unspecified: Secondary | ICD-10-CM | POA: Diagnosis not present

## 2024-01-20 DIAGNOSIS — L821 Other seborrheic keratosis: Secondary | ICD-10-CM | POA: Diagnosis not present

## 2024-01-20 DIAGNOSIS — Z08 Encounter for follow-up examination after completed treatment for malignant neoplasm: Secondary | ICD-10-CM | POA: Diagnosis not present

## 2024-03-02 DIAGNOSIS — C499 Malignant neoplasm of connective and soft tissue, unspecified: Secondary | ICD-10-CM | POA: Diagnosis not present

## 2024-03-04 DIAGNOSIS — C787 Secondary malignant neoplasm of liver and intrahepatic bile duct: Secondary | ICD-10-CM | POA: Diagnosis not present

## 2024-03-04 DIAGNOSIS — C7801 Secondary malignant neoplasm of right lung: Secondary | ICD-10-CM | POA: Diagnosis not present

## 2024-03-04 DIAGNOSIS — C7802 Secondary malignant neoplasm of left lung: Secondary | ICD-10-CM | POA: Diagnosis not present

## 2024-03-04 DIAGNOSIS — C499 Malignant neoplasm of connective and soft tissue, unspecified: Secondary | ICD-10-CM | POA: Diagnosis not present

## 2024-03-11 DIAGNOSIS — C7802 Secondary malignant neoplasm of left lung: Secondary | ICD-10-CM | POA: Diagnosis not present

## 2024-03-11 DIAGNOSIS — C787 Secondary malignant neoplasm of liver and intrahepatic bile duct: Secondary | ICD-10-CM | POA: Diagnosis not present

## 2024-03-11 DIAGNOSIS — D4819 Other specified neoplasm of uncertain behavior of connective and other soft tissue: Secondary | ICD-10-CM | POA: Diagnosis not present

## 2024-03-11 DIAGNOSIS — D649 Anemia, unspecified: Secondary | ICD-10-CM | POA: Diagnosis not present

## 2024-03-11 DIAGNOSIS — R509 Fever, unspecified: Secondary | ICD-10-CM | POA: Diagnosis not present

## 2024-03-11 DIAGNOSIS — C7801 Secondary malignant neoplasm of right lung: Secondary | ICD-10-CM | POA: Diagnosis not present

## 2024-03-11 DIAGNOSIS — C499 Malignant neoplasm of connective and soft tissue, unspecified: Secondary | ICD-10-CM | POA: Diagnosis not present

## 2024-03-11 DIAGNOSIS — Z5111 Encounter for antineoplastic chemotherapy: Secondary | ICD-10-CM | POA: Diagnosis not present

## 2024-03-19 DIAGNOSIS — Z5111 Encounter for antineoplastic chemotherapy: Secondary | ICD-10-CM | POA: Diagnosis not present

## 2024-03-19 DIAGNOSIS — C499 Malignant neoplasm of connective and soft tissue, unspecified: Secondary | ICD-10-CM | POA: Diagnosis not present

## 2024-03-19 DIAGNOSIS — I340 Nonrheumatic mitral (valve) insufficiency: Secondary | ICD-10-CM | POA: Diagnosis not present

## 2024-03-20 DIAGNOSIS — C499 Malignant neoplasm of connective and soft tissue, unspecified: Secondary | ICD-10-CM | POA: Diagnosis not present

## 2024-03-20 DIAGNOSIS — C7801 Secondary malignant neoplasm of right lung: Secondary | ICD-10-CM | POA: Diagnosis not present

## 2024-03-20 DIAGNOSIS — C787 Secondary malignant neoplasm of liver and intrahepatic bile duct: Secondary | ICD-10-CM | POA: Diagnosis not present

## 2024-03-20 DIAGNOSIS — C7802 Secondary malignant neoplasm of left lung: Secondary | ICD-10-CM | POA: Diagnosis not present

## 2024-03-31 DIAGNOSIS — D4819 Other specified neoplasm of uncertain behavior of connective and other soft tissue: Secondary | ICD-10-CM | POA: Diagnosis not present

## 2024-03-31 DIAGNOSIS — C7801 Secondary malignant neoplasm of right lung: Secondary | ICD-10-CM | POA: Diagnosis not present

## 2024-03-31 DIAGNOSIS — D649 Anemia, unspecified: Secondary | ICD-10-CM | POA: Diagnosis not present

## 2024-03-31 DIAGNOSIS — C7802 Secondary malignant neoplasm of left lung: Secondary | ICD-10-CM | POA: Diagnosis not present

## 2024-03-31 DIAGNOSIS — C499 Malignant neoplasm of connective and soft tissue, unspecified: Secondary | ICD-10-CM | POA: Diagnosis not present

## 2024-03-31 DIAGNOSIS — R509 Fever, unspecified: Secondary | ICD-10-CM | POA: Diagnosis not present

## 2024-03-31 DIAGNOSIS — Z5111 Encounter for antineoplastic chemotherapy: Secondary | ICD-10-CM | POA: Diagnosis not present

## 2024-03-31 DIAGNOSIS — C787 Secondary malignant neoplasm of liver and intrahepatic bile duct: Secondary | ICD-10-CM | POA: Diagnosis not present

## 2024-04-07 DIAGNOSIS — C499 Malignant neoplasm of connective and soft tissue, unspecified: Secondary | ICD-10-CM | POA: Diagnosis not present

## 2024-04-07 DIAGNOSIS — Z90710 Acquired absence of both cervix and uterus: Secondary | ICD-10-CM | POA: Diagnosis not present

## 2024-04-07 DIAGNOSIS — Z01419 Encounter for gynecological examination (general) (routine) without abnormal findings: Secondary | ICD-10-CM | POA: Diagnosis not present

## 2024-04-07 DIAGNOSIS — C7800 Secondary malignant neoplasm of unspecified lung: Secondary | ICD-10-CM | POA: Diagnosis not present

## 2024-04-09 DIAGNOSIS — C3492 Malignant neoplasm of unspecified part of left bronchus or lung: Secondary | ICD-10-CM | POA: Diagnosis not present

## 2024-04-09 DIAGNOSIS — R509 Fever, unspecified: Secondary | ICD-10-CM | POA: Diagnosis not present

## 2024-04-09 DIAGNOSIS — C7801 Secondary malignant neoplasm of right lung: Secondary | ICD-10-CM | POA: Diagnosis not present

## 2024-04-09 DIAGNOSIS — D61818 Other pancytopenia: Secondary | ICD-10-CM | POA: Diagnosis not present

## 2024-04-09 DIAGNOSIS — I2510 Atherosclerotic heart disease of native coronary artery without angina pectoris: Secondary | ICD-10-CM | POA: Diagnosis not present

## 2024-04-09 DIAGNOSIS — C3491 Malignant neoplasm of unspecified part of right bronchus or lung: Secondary | ICD-10-CM | POA: Diagnosis not present

## 2024-04-09 DIAGNOSIS — D709 Neutropenia, unspecified: Secondary | ICD-10-CM | POA: Diagnosis not present

## 2024-04-09 DIAGNOSIS — C499 Malignant neoplasm of connective and soft tissue, unspecified: Secondary | ICD-10-CM | POA: Diagnosis not present

## 2024-04-09 DIAGNOSIS — E785 Hyperlipidemia, unspecified: Secondary | ICD-10-CM | POA: Diagnosis not present

## 2024-04-09 DIAGNOSIS — C7802 Secondary malignant neoplasm of left lung: Secondary | ICD-10-CM | POA: Diagnosis not present

## 2024-04-09 DIAGNOSIS — R7401 Elevation of levels of liver transaminase levels: Secondary | ICD-10-CM | POA: Diagnosis not present

## 2024-04-09 DIAGNOSIS — K219 Gastro-esophageal reflux disease without esophagitis: Secondary | ICD-10-CM | POA: Diagnosis not present

## 2024-04-09 DIAGNOSIS — R5081 Fever presenting with conditions classified elsewhere: Secondary | ICD-10-CM | POA: Diagnosis not present

## 2024-04-09 DIAGNOSIS — I10 Essential (primary) hypertension: Secondary | ICD-10-CM | POA: Diagnosis not present

## 2024-04-09 DIAGNOSIS — R918 Other nonspecific abnormal finding of lung field: Secondary | ICD-10-CM | POA: Diagnosis not present

## 2024-04-11 DIAGNOSIS — R509 Fever, unspecified: Secondary | ICD-10-CM | POA: Diagnosis not present

## 2024-04-16 DIAGNOSIS — M5441 Lumbago with sciatica, right side: Secondary | ICD-10-CM | POA: Diagnosis not present

## 2024-04-16 DIAGNOSIS — Z7689 Persons encountering health services in other specified circumstances: Secondary | ICD-10-CM | POA: Diagnosis not present

## 2024-04-16 DIAGNOSIS — C494 Malignant neoplasm of connective and soft tissue of abdomen: Secondary | ICD-10-CM | POA: Diagnosis not present

## 2024-04-16 DIAGNOSIS — C499 Malignant neoplasm of connective and soft tissue, unspecified: Secondary | ICD-10-CM | POA: Diagnosis not present

## 2024-04-16 DIAGNOSIS — M5442 Lumbago with sciatica, left side: Secondary | ICD-10-CM | POA: Diagnosis not present

## 2024-04-16 DIAGNOSIS — C7802 Secondary malignant neoplasm of left lung: Secondary | ICD-10-CM | POA: Diagnosis not present

## 2024-04-16 DIAGNOSIS — C7801 Secondary malignant neoplasm of right lung: Secondary | ICD-10-CM | POA: Diagnosis not present

## 2024-04-16 DIAGNOSIS — C787 Secondary malignant neoplasm of liver and intrahepatic bile duct: Secondary | ICD-10-CM | POA: Diagnosis not present

## 2024-04-17 DIAGNOSIS — C787 Secondary malignant neoplasm of liver and intrahepatic bile duct: Secondary | ICD-10-CM | POA: Diagnosis not present

## 2024-04-17 DIAGNOSIS — M5441 Lumbago with sciatica, right side: Secondary | ICD-10-CM | POA: Diagnosis not present

## 2024-04-17 DIAGNOSIS — I10 Essential (primary) hypertension: Secondary | ICD-10-CM | POA: Diagnosis not present

## 2024-04-17 DIAGNOSIS — C499 Malignant neoplasm of connective and soft tissue, unspecified: Secondary | ICD-10-CM | POA: Diagnosis not present

## 2024-04-17 DIAGNOSIS — M25561 Pain in right knee: Secondary | ICD-10-CM | POA: Diagnosis not present

## 2024-04-17 DIAGNOSIS — M5442 Lumbago with sciatica, left side: Secondary | ICD-10-CM | POA: Diagnosis not present

## 2024-04-17 DIAGNOSIS — Z7689 Persons encountering health services in other specified circumstances: Secondary | ICD-10-CM | POA: Diagnosis not present

## 2024-04-17 DIAGNOSIS — M11261 Other chondrocalcinosis, right knee: Secondary | ICD-10-CM | POA: Diagnosis not present

## 2024-04-17 DIAGNOSIS — D84821 Immunodeficiency due to drugs: Secondary | ICD-10-CM | POA: Diagnosis not present

## 2024-04-17 DIAGNOSIS — L270 Generalized skin eruption due to drugs and medicaments taken internally: Secondary | ICD-10-CM | POA: Diagnosis not present

## 2024-04-17 DIAGNOSIS — R918 Other nonspecific abnormal finding of lung field: Secondary | ICD-10-CM | POA: Diagnosis not present

## 2024-04-17 DIAGNOSIS — R0602 Shortness of breath: Secondary | ICD-10-CM | POA: Diagnosis not present

## 2024-04-17 DIAGNOSIS — C7802 Secondary malignant neoplasm of left lung: Secondary | ICD-10-CM | POA: Diagnosis not present

## 2024-04-17 DIAGNOSIS — R000 Tachycardia, unspecified: Secondary | ICD-10-CM | POA: Diagnosis not present

## 2024-04-17 DIAGNOSIS — C494 Malignant neoplasm of connective and soft tissue of abdomen: Secondary | ICD-10-CM | POA: Diagnosis not present

## 2024-04-17 DIAGNOSIS — D61810 Antineoplastic chemotherapy induced pancytopenia: Secondary | ICD-10-CM | POA: Diagnosis not present

## 2024-04-17 DIAGNOSIS — C7801 Secondary malignant neoplasm of right lung: Secondary | ICD-10-CM | POA: Diagnosis not present

## 2024-04-17 DIAGNOSIS — R509 Fever, unspecified: Secondary | ICD-10-CM | POA: Diagnosis not present

## 2024-04-20 DIAGNOSIS — R0602 Shortness of breath: Secondary | ICD-10-CM | POA: Diagnosis not present

## 2024-04-20 DIAGNOSIS — C7801 Secondary malignant neoplasm of right lung: Secondary | ICD-10-CM | POA: Diagnosis not present

## 2024-04-20 DIAGNOSIS — R918 Other nonspecific abnormal finding of lung field: Secondary | ICD-10-CM | POA: Diagnosis not present

## 2024-04-20 DIAGNOSIS — L270 Generalized skin eruption due to drugs and medicaments taken internally: Secondary | ICD-10-CM | POA: Diagnosis not present

## 2024-04-20 DIAGNOSIS — C499 Malignant neoplasm of connective and soft tissue, unspecified: Secondary | ICD-10-CM | POA: Diagnosis not present

## 2024-04-20 DIAGNOSIS — M11261 Other chondrocalcinosis, right knee: Secondary | ICD-10-CM | POA: Diagnosis not present

## 2024-04-20 DIAGNOSIS — I10 Essential (primary) hypertension: Secondary | ICD-10-CM | POA: Diagnosis not present

## 2024-04-20 DIAGNOSIS — D61810 Antineoplastic chemotherapy induced pancytopenia: Secondary | ICD-10-CM | POA: Diagnosis not present

## 2024-04-20 DIAGNOSIS — R509 Fever, unspecified: Secondary | ICD-10-CM | POA: Diagnosis not present

## 2024-04-20 DIAGNOSIS — C787 Secondary malignant neoplasm of liver and intrahepatic bile duct: Secondary | ICD-10-CM | POA: Diagnosis not present

## 2024-04-20 DIAGNOSIS — C7802 Secondary malignant neoplasm of left lung: Secondary | ICD-10-CM | POA: Diagnosis not present

## 2024-04-20 DIAGNOSIS — R000 Tachycardia, unspecified: Secondary | ICD-10-CM | POA: Diagnosis not present

## 2024-04-20 DIAGNOSIS — D84821 Immunodeficiency due to drugs: Secondary | ICD-10-CM | POA: Diagnosis not present

## 2024-04-20 DIAGNOSIS — M25561 Pain in right knee: Secondary | ICD-10-CM | POA: Diagnosis not present

## 2024-05-01 DIAGNOSIS — M4312 Spondylolisthesis, cervical region: Secondary | ICD-10-CM | POA: Diagnosis not present

## 2024-05-01 DIAGNOSIS — C7802 Secondary malignant neoplasm of left lung: Secondary | ICD-10-CM | POA: Diagnosis not present

## 2024-05-01 DIAGNOSIS — M545 Low back pain, unspecified: Secondary | ICD-10-CM | POA: Diagnosis not present

## 2024-05-01 DIAGNOSIS — M5441 Lumbago with sciatica, right side: Secondary | ICD-10-CM | POA: Diagnosis not present

## 2024-05-01 DIAGNOSIS — C499 Malignant neoplasm of connective and soft tissue, unspecified: Secondary | ICD-10-CM | POA: Diagnosis not present

## 2024-05-01 DIAGNOSIS — C787 Secondary malignant neoplasm of liver and intrahepatic bile duct: Secondary | ICD-10-CM | POA: Diagnosis not present

## 2024-05-01 DIAGNOSIS — M5442 Lumbago with sciatica, left side: Secondary | ICD-10-CM | POA: Diagnosis not present

## 2024-05-05 DIAGNOSIS — C787 Secondary malignant neoplasm of liver and intrahepatic bile duct: Secondary | ICD-10-CM | POA: Diagnosis not present

## 2024-05-05 DIAGNOSIS — C499 Malignant neoplasm of connective and soft tissue, unspecified: Secondary | ICD-10-CM | POA: Diagnosis not present

## 2024-05-05 DIAGNOSIS — C7801 Secondary malignant neoplasm of right lung: Secondary | ICD-10-CM | POA: Diagnosis not present

## 2024-05-05 DIAGNOSIS — Z452 Encounter for adjustment and management of vascular access device: Secondary | ICD-10-CM | POA: Diagnosis not present

## 2024-05-05 DIAGNOSIS — C7800 Secondary malignant neoplasm of unspecified lung: Secondary | ICD-10-CM | POA: Diagnosis not present

## 2024-05-05 DIAGNOSIS — Z5111 Encounter for antineoplastic chemotherapy: Secondary | ICD-10-CM | POA: Diagnosis not present

## 2024-05-05 DIAGNOSIS — Z9221 Personal history of antineoplastic chemotherapy: Secondary | ICD-10-CM | POA: Diagnosis not present

## 2024-05-05 DIAGNOSIS — N2889 Other specified disorders of kidney and ureter: Secondary | ICD-10-CM | POA: Diagnosis not present

## 2024-05-05 DIAGNOSIS — C7802 Secondary malignant neoplasm of left lung: Secondary | ICD-10-CM | POA: Diagnosis not present

## 2024-05-26 DIAGNOSIS — N2889 Other specified disorders of kidney and ureter: Secondary | ICD-10-CM | POA: Diagnosis not present

## 2024-05-26 DIAGNOSIS — C7802 Secondary malignant neoplasm of left lung: Secondary | ICD-10-CM | POA: Diagnosis not present

## 2024-05-26 DIAGNOSIS — R1909 Other intra-abdominal and pelvic swelling, mass and lump: Secondary | ICD-10-CM | POA: Diagnosis not present

## 2024-05-26 DIAGNOSIS — Z452 Encounter for adjustment and management of vascular access device: Secondary | ICD-10-CM | POA: Diagnosis not present

## 2024-05-26 DIAGNOSIS — Z5111 Encounter for antineoplastic chemotherapy: Secondary | ICD-10-CM | POA: Diagnosis not present

## 2024-05-26 DIAGNOSIS — C7801 Secondary malignant neoplasm of right lung: Secondary | ICD-10-CM | POA: Diagnosis not present

## 2024-05-26 DIAGNOSIS — R918 Other nonspecific abnormal finding of lung field: Secondary | ICD-10-CM | POA: Diagnosis not present

## 2024-05-26 DIAGNOSIS — C499 Malignant neoplasm of connective and soft tissue, unspecified: Secondary | ICD-10-CM | POA: Diagnosis not present

## 2024-05-26 DIAGNOSIS — K668 Other specified disorders of peritoneum: Secondary | ICD-10-CM | POA: Diagnosis not present

## 2024-05-26 DIAGNOSIS — C787 Secondary malignant neoplasm of liver and intrahepatic bile duct: Secondary | ICD-10-CM | POA: Diagnosis not present

## 2024-05-26 DIAGNOSIS — C7800 Secondary malignant neoplasm of unspecified lung: Secondary | ICD-10-CM | POA: Diagnosis not present

## 2024-05-26 DIAGNOSIS — K7689 Other specified diseases of liver: Secondary | ICD-10-CM | POA: Diagnosis not present

## 2024-05-26 DIAGNOSIS — D649 Anemia, unspecified: Secondary | ICD-10-CM | POA: Diagnosis not present

## 2024-05-26 DIAGNOSIS — R1010 Upper abdominal pain, unspecified: Secondary | ICD-10-CM | POA: Diagnosis not present

## 2024-06-16 DIAGNOSIS — C499 Malignant neoplasm of connective and soft tissue, unspecified: Secondary | ICD-10-CM | POA: Diagnosis not present

## 2024-06-16 DIAGNOSIS — C787 Secondary malignant neoplasm of liver and intrahepatic bile duct: Secondary | ICD-10-CM | POA: Diagnosis not present

## 2024-06-16 DIAGNOSIS — Z5111 Encounter for antineoplastic chemotherapy: Secondary | ICD-10-CM | POA: Diagnosis not present

## 2024-06-16 DIAGNOSIS — C7801 Secondary malignant neoplasm of right lung: Secondary | ICD-10-CM | POA: Diagnosis not present

## 2024-06-16 DIAGNOSIS — C7802 Secondary malignant neoplasm of left lung: Secondary | ICD-10-CM | POA: Diagnosis not present

## 2024-06-23 DIAGNOSIS — C7801 Secondary malignant neoplasm of right lung: Secondary | ICD-10-CM | POA: Diagnosis not present

## 2024-06-23 DIAGNOSIS — C787 Secondary malignant neoplasm of liver and intrahepatic bile duct: Secondary | ICD-10-CM | POA: Diagnosis not present

## 2024-06-23 DIAGNOSIS — Z5111 Encounter for antineoplastic chemotherapy: Secondary | ICD-10-CM | POA: Diagnosis not present

## 2024-06-23 DIAGNOSIS — C7802 Secondary malignant neoplasm of left lung: Secondary | ICD-10-CM | POA: Diagnosis not present

## 2024-06-23 DIAGNOSIS — C499 Malignant neoplasm of connective and soft tissue, unspecified: Secondary | ICD-10-CM | POA: Diagnosis not present

## 2024-07-07 DIAGNOSIS — T451X5A Adverse effect of antineoplastic and immunosuppressive drugs, initial encounter: Secondary | ICD-10-CM | POA: Diagnosis not present

## 2024-07-07 DIAGNOSIS — C7801 Secondary malignant neoplasm of right lung: Secondary | ICD-10-CM | POA: Diagnosis not present

## 2024-07-07 DIAGNOSIS — C787 Secondary malignant neoplasm of liver and intrahepatic bile duct: Secondary | ICD-10-CM | POA: Diagnosis not present

## 2024-07-07 DIAGNOSIS — C7802 Secondary malignant neoplasm of left lung: Secondary | ICD-10-CM | POA: Diagnosis not present

## 2024-07-07 DIAGNOSIS — Z5111 Encounter for antineoplastic chemotherapy: Secondary | ICD-10-CM | POA: Diagnosis not present

## 2024-07-07 DIAGNOSIS — R61 Generalized hyperhidrosis: Secondary | ICD-10-CM | POA: Diagnosis not present

## 2024-07-07 DIAGNOSIS — C499 Malignant neoplasm of connective and soft tissue, unspecified: Secondary | ICD-10-CM | POA: Diagnosis not present

## 2024-07-07 DIAGNOSIS — R112 Nausea with vomiting, unspecified: Secondary | ICD-10-CM | POA: Diagnosis not present

## 2024-07-14 DIAGNOSIS — C787 Secondary malignant neoplasm of liver and intrahepatic bile duct: Secondary | ICD-10-CM | POA: Diagnosis not present

## 2024-07-14 DIAGNOSIS — C499 Malignant neoplasm of connective and soft tissue, unspecified: Secondary | ICD-10-CM | POA: Diagnosis not present

## 2024-07-14 DIAGNOSIS — Z5111 Encounter for antineoplastic chemotherapy: Secondary | ICD-10-CM | POA: Diagnosis not present

## 2024-07-14 DIAGNOSIS — T451X5A Adverse effect of antineoplastic and immunosuppressive drugs, initial encounter: Secondary | ICD-10-CM | POA: Diagnosis not present

## 2024-07-14 DIAGNOSIS — C7802 Secondary malignant neoplasm of left lung: Secondary | ICD-10-CM | POA: Diagnosis not present

## 2024-07-14 DIAGNOSIS — R61 Generalized hyperhidrosis: Secondary | ICD-10-CM | POA: Diagnosis not present

## 2024-07-14 DIAGNOSIS — C7801 Secondary malignant neoplasm of right lung: Secondary | ICD-10-CM | POA: Diagnosis not present

## 2024-07-14 DIAGNOSIS — R112 Nausea with vomiting, unspecified: Secondary | ICD-10-CM | POA: Diagnosis not present

## 2024-07-21 DIAGNOSIS — C499 Malignant neoplasm of connective and soft tissue, unspecified: Secondary | ICD-10-CM | POA: Diagnosis not present

## 2024-07-21 DIAGNOSIS — C7801 Secondary malignant neoplasm of right lung: Secondary | ICD-10-CM | POA: Diagnosis not present

## 2024-07-21 DIAGNOSIS — Z5111 Encounter for antineoplastic chemotherapy: Secondary | ICD-10-CM | POA: Diagnosis not present

## 2024-07-21 DIAGNOSIS — T451X5A Adverse effect of antineoplastic and immunosuppressive drugs, initial encounter: Secondary | ICD-10-CM | POA: Diagnosis not present

## 2024-07-21 DIAGNOSIS — R61 Generalized hyperhidrosis: Secondary | ICD-10-CM | POA: Diagnosis not present

## 2024-07-21 DIAGNOSIS — C787 Secondary malignant neoplasm of liver and intrahepatic bile duct: Secondary | ICD-10-CM | POA: Diagnosis not present

## 2024-07-21 DIAGNOSIS — C7802 Secondary malignant neoplasm of left lung: Secondary | ICD-10-CM | POA: Diagnosis not present

## 2024-07-21 DIAGNOSIS — R112 Nausea with vomiting, unspecified: Secondary | ICD-10-CM | POA: Diagnosis not present

## 2024-07-22 DIAGNOSIS — C787 Secondary malignant neoplasm of liver and intrahepatic bile duct: Secondary | ICD-10-CM | POA: Diagnosis not present

## 2024-07-22 DIAGNOSIS — T451X5A Adverse effect of antineoplastic and immunosuppressive drugs, initial encounter: Secondary | ICD-10-CM | POA: Diagnosis not present

## 2024-07-22 DIAGNOSIS — R112 Nausea with vomiting, unspecified: Secondary | ICD-10-CM | POA: Diagnosis not present

## 2024-07-22 DIAGNOSIS — R61 Generalized hyperhidrosis: Secondary | ICD-10-CM | POA: Diagnosis not present

## 2024-07-22 DIAGNOSIS — C7802 Secondary malignant neoplasm of left lung: Secondary | ICD-10-CM | POA: Diagnosis not present

## 2024-07-22 DIAGNOSIS — Z5111 Encounter for antineoplastic chemotherapy: Secondary | ICD-10-CM | POA: Diagnosis not present

## 2024-07-22 DIAGNOSIS — C499 Malignant neoplasm of connective and soft tissue, unspecified: Secondary | ICD-10-CM | POA: Diagnosis not present

## 2024-07-22 DIAGNOSIS — C7801 Secondary malignant neoplasm of right lung: Secondary | ICD-10-CM | POA: Diagnosis not present

## 2024-08-04 DIAGNOSIS — C7800 Secondary malignant neoplasm of unspecified lung: Secondary | ICD-10-CM | POA: Diagnosis not present

## 2024-08-04 DIAGNOSIS — C787 Secondary malignant neoplasm of liver and intrahepatic bile duct: Secondary | ICD-10-CM | POA: Diagnosis not present

## 2024-08-04 DIAGNOSIS — C499 Malignant neoplasm of connective and soft tissue, unspecified: Secondary | ICD-10-CM | POA: Diagnosis not present

## 2024-08-04 DIAGNOSIS — D649 Anemia, unspecified: Secondary | ICD-10-CM | POA: Diagnosis not present

## 2024-08-04 DIAGNOSIS — C7801 Secondary malignant neoplasm of right lung: Secondary | ICD-10-CM | POA: Diagnosis not present

## 2024-08-04 DIAGNOSIS — C7802 Secondary malignant neoplasm of left lung: Secondary | ICD-10-CM | POA: Diagnosis not present

## 2024-08-04 DIAGNOSIS — Z5111 Encounter for antineoplastic chemotherapy: Secondary | ICD-10-CM | POA: Diagnosis not present

## 2024-08-11 DIAGNOSIS — C7802 Secondary malignant neoplasm of left lung: Secondary | ICD-10-CM | POA: Diagnosis not present

## 2024-08-11 DIAGNOSIS — C7801 Secondary malignant neoplasm of right lung: Secondary | ICD-10-CM | POA: Diagnosis not present

## 2024-08-11 DIAGNOSIS — C7800 Secondary malignant neoplasm of unspecified lung: Secondary | ICD-10-CM | POA: Diagnosis not present

## 2024-08-11 DIAGNOSIS — C499 Malignant neoplasm of connective and soft tissue, unspecified: Secondary | ICD-10-CM | POA: Diagnosis not present

## 2024-08-11 DIAGNOSIS — D649 Anemia, unspecified: Secondary | ICD-10-CM | POA: Diagnosis not present

## 2024-08-11 DIAGNOSIS — Z5111 Encounter for antineoplastic chemotherapy: Secondary | ICD-10-CM | POA: Diagnosis not present

## 2024-08-11 DIAGNOSIS — C787 Secondary malignant neoplasm of liver and intrahepatic bile duct: Secondary | ICD-10-CM | POA: Diagnosis not present

## 2024-08-12 DIAGNOSIS — C787 Secondary malignant neoplasm of liver and intrahepatic bile duct: Secondary | ICD-10-CM | POA: Diagnosis not present

## 2024-08-12 DIAGNOSIS — C7801 Secondary malignant neoplasm of right lung: Secondary | ICD-10-CM | POA: Diagnosis not present

## 2024-08-12 DIAGNOSIS — D649 Anemia, unspecified: Secondary | ICD-10-CM | POA: Diagnosis not present

## 2024-08-12 DIAGNOSIS — C7800 Secondary malignant neoplasm of unspecified lung: Secondary | ICD-10-CM | POA: Diagnosis not present

## 2024-08-12 DIAGNOSIS — C499 Malignant neoplasm of connective and soft tissue, unspecified: Secondary | ICD-10-CM | POA: Diagnosis not present

## 2024-08-12 DIAGNOSIS — Z5111 Encounter for antineoplastic chemotherapy: Secondary | ICD-10-CM | POA: Diagnosis not present

## 2024-08-12 DIAGNOSIS — C7802 Secondary malignant neoplasm of left lung: Secondary | ICD-10-CM | POA: Diagnosis not present

## 2024-08-18 DIAGNOSIS — C7802 Secondary malignant neoplasm of left lung: Secondary | ICD-10-CM | POA: Diagnosis not present

## 2024-08-18 DIAGNOSIS — C499 Malignant neoplasm of connective and soft tissue, unspecified: Secondary | ICD-10-CM | POA: Diagnosis not present

## 2024-08-18 DIAGNOSIS — C7801 Secondary malignant neoplasm of right lung: Secondary | ICD-10-CM | POA: Diagnosis not present

## 2024-08-18 DIAGNOSIS — C787 Secondary malignant neoplasm of liver and intrahepatic bile duct: Secondary | ICD-10-CM | POA: Diagnosis not present

## 2024-08-25 DIAGNOSIS — C499 Malignant neoplasm of connective and soft tissue, unspecified: Secondary | ICD-10-CM | POA: Diagnosis not present

## 2024-08-25 DIAGNOSIS — C7800 Secondary malignant neoplasm of unspecified lung: Secondary | ICD-10-CM | POA: Diagnosis not present

## 2024-08-25 DIAGNOSIS — C7802 Secondary malignant neoplasm of left lung: Secondary | ICD-10-CM | POA: Diagnosis not present

## 2024-08-25 DIAGNOSIS — C787 Secondary malignant neoplasm of liver and intrahepatic bile duct: Secondary | ICD-10-CM | POA: Diagnosis not present

## 2024-08-25 DIAGNOSIS — C7801 Secondary malignant neoplasm of right lung: Secondary | ICD-10-CM | POA: Diagnosis not present

## 2024-08-25 DIAGNOSIS — Z5111 Encounter for antineoplastic chemotherapy: Secondary | ICD-10-CM | POA: Diagnosis not present

## 2024-08-25 DIAGNOSIS — D649 Anemia, unspecified: Secondary | ICD-10-CM | POA: Diagnosis not present

## 2024-08-26 DIAGNOSIS — C7801 Secondary malignant neoplasm of right lung: Secondary | ICD-10-CM | POA: Diagnosis not present

## 2024-08-26 DIAGNOSIS — C499 Malignant neoplasm of connective and soft tissue, unspecified: Secondary | ICD-10-CM | POA: Diagnosis not present

## 2024-08-26 DIAGNOSIS — D649 Anemia, unspecified: Secondary | ICD-10-CM | POA: Diagnosis not present

## 2024-08-26 DIAGNOSIS — C7802 Secondary malignant neoplasm of left lung: Secondary | ICD-10-CM | POA: Diagnosis not present

## 2024-08-26 DIAGNOSIS — Z5111 Encounter for antineoplastic chemotherapy: Secondary | ICD-10-CM | POA: Diagnosis not present

## 2024-08-26 DIAGNOSIS — C787 Secondary malignant neoplasm of liver and intrahepatic bile duct: Secondary | ICD-10-CM | POA: Diagnosis not present

## 2024-08-26 DIAGNOSIS — C7800 Secondary malignant neoplasm of unspecified lung: Secondary | ICD-10-CM | POA: Diagnosis not present

## 2024-09-15 DIAGNOSIS — Z5111 Encounter for antineoplastic chemotherapy: Secondary | ICD-10-CM | POA: Diagnosis not present

## 2024-09-15 DIAGNOSIS — C787 Secondary malignant neoplasm of liver and intrahepatic bile duct: Secondary | ICD-10-CM | POA: Diagnosis not present

## 2024-09-15 DIAGNOSIS — D508 Other iron deficiency anemias: Secondary | ICD-10-CM | POA: Diagnosis not present

## 2024-09-15 DIAGNOSIS — C7801 Secondary malignant neoplasm of right lung: Secondary | ICD-10-CM | POA: Diagnosis not present

## 2024-09-15 DIAGNOSIS — C480 Malignant neoplasm of retroperitoneum: Secondary | ICD-10-CM | POA: Diagnosis not present

## 2024-09-15 DIAGNOSIS — C499 Malignant neoplasm of connective and soft tissue, unspecified: Secondary | ICD-10-CM | POA: Diagnosis not present

## 2024-09-15 DIAGNOSIS — C7802 Secondary malignant neoplasm of left lung: Secondary | ICD-10-CM | POA: Diagnosis not present

## 2024-09-15 DIAGNOSIS — E46 Unspecified protein-calorie malnutrition: Secondary | ICD-10-CM | POA: Diagnosis not present

## 2024-09-15 DIAGNOSIS — N2889 Other specified disorders of kidney and ureter: Secondary | ICD-10-CM | POA: Diagnosis not present

## 2024-09-15 DIAGNOSIS — Z9221 Personal history of antineoplastic chemotherapy: Secondary | ICD-10-CM | POA: Diagnosis not present

## 2024-09-22 DIAGNOSIS — Z5111 Encounter for antineoplastic chemotherapy: Secondary | ICD-10-CM | POA: Diagnosis not present

## 2024-09-22 DIAGNOSIS — E46 Unspecified protein-calorie malnutrition: Secondary | ICD-10-CM | POA: Diagnosis not present

## 2024-09-22 DIAGNOSIS — Z9221 Personal history of antineoplastic chemotherapy: Secondary | ICD-10-CM | POA: Diagnosis not present

## 2024-09-22 DIAGNOSIS — C7802 Secondary malignant neoplasm of left lung: Secondary | ICD-10-CM | POA: Diagnosis not present

## 2024-09-22 DIAGNOSIS — C7801 Secondary malignant neoplasm of right lung: Secondary | ICD-10-CM | POA: Diagnosis not present

## 2024-09-22 DIAGNOSIS — C499 Malignant neoplasm of connective and soft tissue, unspecified: Secondary | ICD-10-CM | POA: Diagnosis not present

## 2024-09-22 DIAGNOSIS — N2889 Other specified disorders of kidney and ureter: Secondary | ICD-10-CM | POA: Diagnosis not present

## 2024-09-22 DIAGNOSIS — C787 Secondary malignant neoplasm of liver and intrahepatic bile duct: Secondary | ICD-10-CM | POA: Diagnosis not present

## 2024-09-22 DIAGNOSIS — D508 Other iron deficiency anemias: Secondary | ICD-10-CM | POA: Diagnosis not present

## 2024-09-26 DIAGNOSIS — R0602 Shortness of breath: Secondary | ICD-10-CM | POA: Diagnosis not present

## 2024-09-26 DIAGNOSIS — R1909 Other intra-abdominal and pelvic swelling, mass and lump: Secondary | ICD-10-CM | POA: Diagnosis not present

## 2024-09-26 DIAGNOSIS — C7989 Secondary malignant neoplasm of other specified sites: Secondary | ICD-10-CM | POA: Diagnosis not present

## 2024-09-26 DIAGNOSIS — M79652 Pain in left thigh: Secondary | ICD-10-CM | POA: Diagnosis not present

## 2024-09-26 DIAGNOSIS — C499 Malignant neoplasm of connective and soft tissue, unspecified: Secondary | ICD-10-CM | POA: Diagnosis not present

## 2024-09-26 DIAGNOSIS — C7800 Secondary malignant neoplasm of unspecified lung: Secondary | ICD-10-CM | POA: Diagnosis not present

## 2024-09-26 DIAGNOSIS — R238 Other skin changes: Secondary | ICD-10-CM | POA: Diagnosis not present

## 2024-09-26 DIAGNOSIS — C7900 Secondary malignant neoplasm of unspecified kidney and renal pelvis: Secondary | ICD-10-CM | POA: Diagnosis not present

## 2024-09-26 DIAGNOSIS — R2240 Localized swelling, mass and lump, unspecified lower limb: Secondary | ICD-10-CM | POA: Diagnosis not present

## 2024-09-26 DIAGNOSIS — C787 Secondary malignant neoplasm of liver and intrahepatic bile duct: Secondary | ICD-10-CM | POA: Diagnosis not present

## 2024-09-26 DIAGNOSIS — M7989 Other specified soft tissue disorders: Secondary | ICD-10-CM | POA: Diagnosis not present

## 2024-09-26 DIAGNOSIS — M79662 Pain in left lower leg: Secondary | ICD-10-CM | POA: Diagnosis not present

## 2024-09-29 DIAGNOSIS — N2889 Other specified disorders of kidney and ureter: Secondary | ICD-10-CM | POA: Diagnosis not present

## 2024-09-29 DIAGNOSIS — C7801 Secondary malignant neoplasm of right lung: Secondary | ICD-10-CM | POA: Diagnosis not present

## 2024-09-29 DIAGNOSIS — C787 Secondary malignant neoplasm of liver and intrahepatic bile duct: Secondary | ICD-10-CM | POA: Diagnosis not present

## 2024-09-29 DIAGNOSIS — Z5111 Encounter for antineoplastic chemotherapy: Secondary | ICD-10-CM | POA: Diagnosis not present

## 2024-09-29 DIAGNOSIS — D508 Other iron deficiency anemias: Secondary | ICD-10-CM | POA: Diagnosis not present

## 2024-09-29 DIAGNOSIS — C499 Malignant neoplasm of connective and soft tissue, unspecified: Secondary | ICD-10-CM | POA: Diagnosis not present

## 2024-09-29 DIAGNOSIS — Z9221 Personal history of antineoplastic chemotherapy: Secondary | ICD-10-CM | POA: Diagnosis not present

## 2024-09-29 DIAGNOSIS — C7802 Secondary malignant neoplasm of left lung: Secondary | ICD-10-CM | POA: Diagnosis not present

## 2024-09-29 DIAGNOSIS — E46 Unspecified protein-calorie malnutrition: Secondary | ICD-10-CM | POA: Diagnosis not present

## 2024-09-30 DIAGNOSIS — E46 Unspecified protein-calorie malnutrition: Secondary | ICD-10-CM | POA: Diagnosis not present

## 2024-09-30 DIAGNOSIS — C7802 Secondary malignant neoplasm of left lung: Secondary | ICD-10-CM | POA: Diagnosis not present

## 2024-09-30 DIAGNOSIS — C7801 Secondary malignant neoplasm of right lung: Secondary | ICD-10-CM | POA: Diagnosis not present

## 2024-09-30 DIAGNOSIS — C499 Malignant neoplasm of connective and soft tissue, unspecified: Secondary | ICD-10-CM | POA: Diagnosis not present

## 2024-09-30 DIAGNOSIS — Z9221 Personal history of antineoplastic chemotherapy: Secondary | ICD-10-CM | POA: Diagnosis not present

## 2024-09-30 DIAGNOSIS — D508 Other iron deficiency anemias: Secondary | ICD-10-CM | POA: Diagnosis not present

## 2024-09-30 DIAGNOSIS — N2889 Other specified disorders of kidney and ureter: Secondary | ICD-10-CM | POA: Diagnosis not present

## 2024-09-30 DIAGNOSIS — C787 Secondary malignant neoplasm of liver and intrahepatic bile duct: Secondary | ICD-10-CM | POA: Diagnosis not present

## 2024-09-30 DIAGNOSIS — Z5111 Encounter for antineoplastic chemotherapy: Secondary | ICD-10-CM | POA: Diagnosis not present

## 2024-10-05 DIAGNOSIS — L814 Other melanin hyperpigmentation: Secondary | ICD-10-CM | POA: Diagnosis not present

## 2024-10-05 DIAGNOSIS — L812 Freckles: Secondary | ICD-10-CM | POA: Diagnosis not present

## 2024-10-05 DIAGNOSIS — D1801 Hemangioma of skin and subcutaneous tissue: Secondary | ICD-10-CM | POA: Diagnosis not present

## 2024-10-05 DIAGNOSIS — L821 Other seborrheic keratosis: Secondary | ICD-10-CM | POA: Diagnosis not present

## 2024-10-13 DIAGNOSIS — T451X5A Adverse effect of antineoplastic and immunosuppressive drugs, initial encounter: Secondary | ICD-10-CM | POA: Diagnosis not present

## 2024-10-13 DIAGNOSIS — R112 Nausea with vomiting, unspecified: Secondary | ICD-10-CM | POA: Diagnosis not present

## 2024-10-13 DIAGNOSIS — C787 Secondary malignant neoplasm of liver and intrahepatic bile duct: Secondary | ICD-10-CM | POA: Diagnosis not present

## 2024-10-13 DIAGNOSIS — C7801 Secondary malignant neoplasm of right lung: Secondary | ICD-10-CM | POA: Diagnosis not present

## 2024-10-13 DIAGNOSIS — C7802 Secondary malignant neoplasm of left lung: Secondary | ICD-10-CM | POA: Diagnosis not present

## 2024-10-13 DIAGNOSIS — K5903 Drug induced constipation: Secondary | ICD-10-CM | POA: Diagnosis not present

## 2024-10-13 DIAGNOSIS — C499 Malignant neoplasm of connective and soft tissue, unspecified: Secondary | ICD-10-CM | POA: Diagnosis not present

## 2024-10-20 DIAGNOSIS — D649 Anemia, unspecified: Secondary | ICD-10-CM | POA: Diagnosis not present

## 2024-10-20 DIAGNOSIS — C7802 Secondary malignant neoplasm of left lung: Secondary | ICD-10-CM | POA: Diagnosis not present

## 2024-10-20 DIAGNOSIS — M7989 Other specified soft tissue disorders: Secondary | ICD-10-CM | POA: Diagnosis not present

## 2024-10-20 DIAGNOSIS — R197 Diarrhea, unspecified: Secondary | ICD-10-CM | POA: Diagnosis not present

## 2024-10-20 DIAGNOSIS — N2889 Other specified disorders of kidney and ureter: Secondary | ICD-10-CM | POA: Diagnosis not present

## 2024-10-20 DIAGNOSIS — C499 Malignant neoplasm of connective and soft tissue, unspecified: Secondary | ICD-10-CM | POA: Diagnosis not present

## 2024-10-20 DIAGNOSIS — Z5111 Encounter for antineoplastic chemotherapy: Secondary | ICD-10-CM | POA: Diagnosis not present

## 2024-10-20 DIAGNOSIS — R112 Nausea with vomiting, unspecified: Secondary | ICD-10-CM | POA: Diagnosis not present

## 2024-10-20 DIAGNOSIS — C7801 Secondary malignant neoplasm of right lung: Secondary | ICD-10-CM | POA: Diagnosis not present

## 2024-10-20 DIAGNOSIS — K5903 Drug induced constipation: Secondary | ICD-10-CM | POA: Diagnosis not present

## 2024-10-20 DIAGNOSIS — M79605 Pain in left leg: Secondary | ICD-10-CM | POA: Diagnosis not present

## 2024-10-20 DIAGNOSIS — C787 Secondary malignant neoplasm of liver and intrahepatic bile duct: Secondary | ICD-10-CM | POA: Diagnosis not present

## 2024-10-26 DIAGNOSIS — M7989 Other specified soft tissue disorders: Secondary | ICD-10-CM | POA: Diagnosis not present

## 2024-10-27 DIAGNOSIS — T451X5A Adverse effect of antineoplastic and immunosuppressive drugs, initial encounter: Secondary | ICD-10-CM | POA: Diagnosis not present

## 2024-10-27 DIAGNOSIS — C499 Malignant neoplasm of connective and soft tissue, unspecified: Secondary | ICD-10-CM | POA: Diagnosis not present

## 2024-10-27 DIAGNOSIS — K5903 Drug induced constipation: Secondary | ICD-10-CM | POA: Diagnosis not present

## 2024-10-27 DIAGNOSIS — C787 Secondary malignant neoplasm of liver and intrahepatic bile duct: Secondary | ICD-10-CM | POA: Diagnosis not present

## 2024-10-27 DIAGNOSIS — C7801 Secondary malignant neoplasm of right lung: Secondary | ICD-10-CM | POA: Diagnosis not present

## 2024-10-27 DIAGNOSIS — M79605 Pain in left leg: Secondary | ICD-10-CM | POA: Diagnosis not present

## 2024-10-27 DIAGNOSIS — C7802 Secondary malignant neoplasm of left lung: Secondary | ICD-10-CM | POA: Diagnosis not present

## 2024-10-27 DIAGNOSIS — M7989 Other specified soft tissue disorders: Secondary | ICD-10-CM | POA: Diagnosis not present

## 2024-10-27 DIAGNOSIS — R112 Nausea with vomiting, unspecified: Secondary | ICD-10-CM | POA: Diagnosis not present

## 2024-10-28 DIAGNOSIS — C7802 Secondary malignant neoplasm of left lung: Secondary | ICD-10-CM | POA: Diagnosis not present

## 2024-10-28 DIAGNOSIS — M7989 Other specified soft tissue disorders: Secondary | ICD-10-CM | POA: Diagnosis not present

## 2024-10-28 DIAGNOSIS — C499 Malignant neoplasm of connective and soft tissue, unspecified: Secondary | ICD-10-CM | POA: Diagnosis not present

## 2024-10-28 DIAGNOSIS — M79605 Pain in left leg: Secondary | ICD-10-CM | POA: Diagnosis not present

## 2024-10-28 DIAGNOSIS — Z5111 Encounter for antineoplastic chemotherapy: Secondary | ICD-10-CM | POA: Diagnosis not present

## 2024-10-28 DIAGNOSIS — K5903 Drug induced constipation: Secondary | ICD-10-CM | POA: Diagnosis not present

## 2024-10-28 DIAGNOSIS — C787 Secondary malignant neoplasm of liver and intrahepatic bile duct: Secondary | ICD-10-CM | POA: Diagnosis not present

## 2024-10-28 DIAGNOSIS — C7801 Secondary malignant neoplasm of right lung: Secondary | ICD-10-CM | POA: Diagnosis not present

## 2024-10-28 DIAGNOSIS — R112 Nausea with vomiting, unspecified: Secondary | ICD-10-CM | POA: Diagnosis not present

## 2024-11-03 DIAGNOSIS — R600 Localized edema: Secondary | ICD-10-CM | POA: Diagnosis not present

## 2024-11-03 DIAGNOSIS — E7800 Pure hypercholesterolemia, unspecified: Secondary | ICD-10-CM | POA: Diagnosis not present

## 2024-11-03 DIAGNOSIS — C649 Malignant neoplasm of unspecified kidney, except renal pelvis: Secondary | ICD-10-CM | POA: Diagnosis not present

## 2024-11-03 DIAGNOSIS — R64 Cachexia: Secondary | ICD-10-CM | POA: Diagnosis not present

## 2024-11-03 DIAGNOSIS — Z0000 Encounter for general adult medical examination without abnormal findings: Secondary | ICD-10-CM | POA: Diagnosis not present

## 2024-11-03 DIAGNOSIS — Z1331 Encounter for screening for depression: Secondary | ICD-10-CM | POA: Diagnosis not present

## 2024-11-03 DIAGNOSIS — C499 Malignant neoplasm of connective and soft tissue, unspecified: Secondary | ICD-10-CM | POA: Diagnosis not present

## 2024-11-03 DIAGNOSIS — N1831 Chronic kidney disease, stage 3a: Secondary | ICD-10-CM | POA: Diagnosis not present
